# Patient Record
Sex: Female | Born: 1960 | Race: White | Hispanic: No | Smoking: Former smoker
Health system: Southern US, Community
[De-identification: ages and names within clinical notes are randomized; demographics above are authoritative.]

## PROBLEM LIST (undated history)

## (undated) DIAGNOSIS — J45909 Unspecified asthma, uncomplicated: Secondary | ICD-10-CM

## (undated) DIAGNOSIS — C50919 Malignant neoplasm of unspecified site of unspecified female breast: Secondary | ICD-10-CM

## (undated) DIAGNOSIS — I509 Heart failure, unspecified: Secondary | ICD-10-CM

## (undated) HISTORY — PX: APPENDECTOMY: SHX54

## (undated) HISTORY — PX: ABDOMINAL HYSTERECTOMY: SHX81

---

## 2002-09-23 ENCOUNTER — Emergency Department (HOSPITAL_COMMUNITY): Admission: EM | Admit: 2002-09-23 | Discharge: 2002-09-24 | Payer: Self-pay | Admitting: Emergency Medicine

## 2002-10-25 ENCOUNTER — Emergency Department (HOSPITAL_COMMUNITY): Admission: EM | Admit: 2002-10-25 | Discharge: 2002-10-25 | Payer: Self-pay | Admitting: Emergency Medicine

## 2002-10-27 ENCOUNTER — Encounter: Payer: Self-pay | Admitting: Emergency Medicine

## 2002-10-27 ENCOUNTER — Emergency Department (HOSPITAL_COMMUNITY): Admission: EM | Admit: 2002-10-27 | Discharge: 2002-10-27 | Payer: Self-pay | Admitting: Emergency Medicine

## 2003-06-05 ENCOUNTER — Ambulatory Visit (HOSPITAL_COMMUNITY): Admission: RE | Admit: 2003-06-05 | Discharge: 2003-06-05 | Payer: Self-pay | Admitting: Gastroenterology

## 2003-09-15 ENCOUNTER — Emergency Department (HOSPITAL_COMMUNITY): Admission: EM | Admit: 2003-09-15 | Discharge: 2003-09-15 | Payer: Self-pay | Admitting: Emergency Medicine

## 2004-01-04 ENCOUNTER — Emergency Department (HOSPITAL_COMMUNITY): Admission: EM | Admit: 2004-01-04 | Discharge: 2004-01-04 | Payer: Self-pay | Admitting: Emergency Medicine

## 2004-12-18 ENCOUNTER — Encounter: Admission: RE | Admit: 2004-12-18 | Discharge: 2004-12-18 | Payer: Self-pay | Admitting: Family Medicine

## 2005-04-16 ENCOUNTER — Emergency Department (HOSPITAL_COMMUNITY): Admission: EM | Admit: 2005-04-16 | Discharge: 2005-04-17 | Payer: Self-pay | Admitting: Emergency Medicine

## 2005-08-23 ENCOUNTER — Emergency Department: Payer: Self-pay | Admitting: Unknown Physician Specialty

## 2006-07-06 ENCOUNTER — Emergency Department: Payer: Self-pay | Admitting: Unknown Physician Specialty

## 2008-09-15 ENCOUNTER — Emergency Department: Payer: Self-pay | Admitting: Internal Medicine

## 2010-04-01 ENCOUNTER — Ambulatory Visit
Admission: RE | Admit: 2010-04-01 | Discharge: 2010-04-01 | Payer: Self-pay | Source: Home / Self Care | Attending: Family Medicine | Admitting: Family Medicine

## 2010-04-02 NOTE — Assessment & Plan Note (Signed)
NAME:  Terri Hughes, Terri Hughes NO.:  [id number]  MEDICAL RECORD NO.:  [id number]          PATIENT TYPE:  POB  LOCATION:  [facility] at [facility]         FACILITY:  [facility]  PHYSICIAN:  Tinnie Gens, MD        DATE OF BIRTH:  1960-03-16  DATE OF SERVICE:  04/01/2010                                 CLINIC NOTE  CHIEF COMPLAINT:  Physical exam and questionable prolapse.  HISTORY OF PRESENT ILLNESS:  The patient is a 50 year old gravida 7, para 6-0-1-7 who has a history of delivering twins.  She had 5 vaginal deliveries and 1 cesarean section for transverse lie.  The patient has had a previous hysterectomy for bleeding followed by a bilateral oophorectomy for cyst and scar tissue by Dr. Blima Rich in the past.  The patient had been doing well until in December she had a case of IBS and lot of straining on the toilet and noticed, it felt-like something was protruding out of her and she wondered if she had some trouble with vaginal vault prolapse.  She stated that it was feeling like she was sitting on something, lasted approximately 2 weeks and this seemed to resolve for now.  PAST MEDICAL HISTORY:  Significant for asthma, anxiety, depression, elevated cholesterol, irritable bowel syndrome.  PAST SURGICAL HISTORY:  She had a TVH and a bladder sling, laparoscopic BSO, C-section x1 and appendectomy.  MEDICATIONS:  She is on; 1. Paxil 10 mg p.o. nightly. 2. Xanax 0.5 mg 1 p.o. nightly.  ALLERGIES:  To NARCOTICS which caused rapid and violent emesis.  OBSTETRICAL HISTORY:  She is a G7, P 6-0-1-7, 1 miscarriage, 1 C-section for a transverse lie of her last pregnancy and she had twins vaginally.  GYNECOLOGIC HISTORY:  Last Pap was in 2008.  She has no history of abnormal Pap.  Last mammogram was in 2010 and was normal.  She had a colonoscopy in 2008 and also normal.  FAMILY HISTORY:  Significant for diabetes, heart disease, and high blood pressure.  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  She is a smoker, 2 packs per day for the past 20 years.  She does not drink alcohol.  She takes approximately 2 caffeinated beverages daily.  Her last son is at home who is 7 years old at present.  She has night sweats which has continuous surgical menopause.  PHYSICAL EXAMINATION:  VITAL SIGNS:  Today, her vitals are as noted in the chart. GENERAL:  She is a well-developed, well-nourished female in no acute distress. HEENT:  Normocephalic, atraumatic.  Sclerae anicteric. NECK:  Supple.  Normal thyroid. LUNGS:  Clear bilaterally. CV:  Regular rate and rhythm.  No rubs, gallops, or murmurs. ABDOMEN:  Soft, nontender, nondistended. EXTREMITIES:  No cyanosis, clubbing or edema.  2+ distal pulses. BREASTS:  Symmetric with everted nipples.  No masses.  No supraclavicular or axillary adenopathy. GU:  Normal external female genitalia.  There is some paleness and some loss of rugation in the vagina.  The cuff is intact.  The uterus and adnexa are surgically absent.  On further detailed exam with Valsalva, the patient has a little bit  of descensus in the midline anteriorly but the bladder neck and urethra are well supported.  The vaginal vault has not come down even with standing and Valsalva.  There is no significant rectocele noted.  IMPRESSION: 1. Physical exam. 2. Questionable prolapse, unable to find today.  PLAN: 1. We will send the patient for routine mammogram. 2. I have advised her if she has sensations something like the ones     she had before, she should call us and come in and let us do an     exam during that time.  I can find no true evidence of vaginal     vault prolapse.  She may have a little bit of descensus anteriorly.     However, the rest of the bladder seems well supported and I do not     recommend any sort of intervention at this time.          ______________________________ Tinnie Gens,  MD    TP/MEDQ  D:  04/01/2010  T:  04/02/2010  Job:  [id number]

## 2012-10-15 ENCOUNTER — Emergency Department: Payer: Self-pay | Admitting: Emergency Medicine

## 2013-06-17 ENCOUNTER — Emergency Department: Payer: Self-pay | Admitting: Emergency Medicine

## 2014-05-30 ENCOUNTER — Emergency Department: Payer: Self-pay | Admitting: Emergency Medicine

## 2016-08-27 ENCOUNTER — Emergency Department: Payer: Self-pay

## 2016-08-27 ENCOUNTER — Emergency Department
Admission: EM | Admit: 2016-08-27 | Discharge: 2016-08-27 | Disposition: A | Discharge status: Home / Self Care | Payer: Self-pay | Attending: Emergency Medicine | Admitting: Emergency Medicine

## 2016-08-27 DIAGNOSIS — Y929 Unspecified place or not applicable: Secondary | ICD-10-CM | POA: Insufficient documentation

## 2016-08-27 DIAGNOSIS — Y9389 Activity, other specified: Secondary | ICD-10-CM | POA: Insufficient documentation

## 2016-08-27 DIAGNOSIS — W07XXXA Fall from chair, initial encounter: Secondary | ICD-10-CM | POA: Insufficient documentation

## 2016-08-27 DIAGNOSIS — J45909 Unspecified asthma, uncomplicated: Secondary | ICD-10-CM | POA: Insufficient documentation

## 2016-08-27 DIAGNOSIS — Y999 Unspecified external cause status: Secondary | ICD-10-CM | POA: Insufficient documentation

## 2016-08-27 DIAGNOSIS — S20211A Contusion of right front wall of thorax, initial encounter: Secondary | ICD-10-CM | POA: Insufficient documentation

## 2016-08-27 DIAGNOSIS — I509 Heart failure, unspecified: Secondary | ICD-10-CM | POA: Insufficient documentation

## 2016-08-27 DIAGNOSIS — F17200 Nicotine dependence, unspecified, uncomplicated: Secondary | ICD-10-CM | POA: Insufficient documentation

## 2016-08-27 HISTORY — DX: Heart failure, unspecified: I50.9

## 2016-08-27 HISTORY — DX: Unspecified asthma, uncomplicated: J45.909

## 2016-08-27 NOTE — ED Notes (Signed)
See triage note  States she fell after tripping over her dog   Having pain to chest and rib area   Increased pain with movement and breathing

## 2016-08-27 NOTE — ED Triage Notes (Signed)
Pt states she tripped over her dog while getting up from a chair last night and fell on her chest, states she had a bowl in her hand and was Not able to brace with her hands.. Pt is c/o chest tenderness and BL lateral rib pain.

## 2016-08-27 NOTE — ED Provider Notes (Signed)
[facility] Emergency Department Provider Note   ____________________________________________   First MD Initiated Contact with Patient 08/27/16 1403     (approximate)  I have reviewed the triage vital signs and the nursing notes.   HISTORY  Chief Complaint Fall and Rib Injury    HPI Terri Hughes is a 56 y.o. female patient complaining of anterior and bilateral chest wall pain secondary to a fall last night. Patient states she was getting out of a chair last night and fell. Patient she will need a brace assault because she had a bowl in her pain. Patient complaining of anterior Center chest wall and bilateral rib pain. No palliative measures for complaint.Patient denies dyspnea but stated pain increases with deep inspirations. Patient rates the pain as a 4/10. Patient described a pain as "achy".   Past Medical History:  Diagnosis Date  . Asthma   . CHF (congestive heart failure) ([place])     There are no active problems to display for this patient.   Past Surgical History:  Procedure Laterality Date  . ABDOMINAL HYSTERECTOMY    . APPENDECTOMY    . CESAREAN SECTION      Prior to Admission medications   Not on File    Allergies Codeine  No family history on file.  Social History Social History  Substance Use Topics  . Smoking status: Current Every Day Smoker  . Smokeless tobacco: Never Used  . Alcohol use No    Review of Systems  Constitutional: No fever/chills Eyes: No visual changes. ENT: No sore throat. Cardiovascular: Denies chest pain. Respiratory: Denies shortness of breath. Gastrointestinal: No abdominal pain.  No nausea, no vomiting.  No diarrhea.  No constipation. Genitourinary: Negative for dysuria. Musculoskeletal: Negative for back pain. Skin: Negative for rash. Neurological: Negative for headaches, focal weakness or numbness. Allergic/Immunilogical: Codeine ____________________________________________   PHYSICAL  EXAM:  VITAL SIGNS: ED Triage Vitals  Enc Vitals Group     BP 08/27/16 1349 116/69     Pulse Rate 08/27/16 1349 78     Resp 08/27/16 1349 18     Temp 08/27/16 1349 98.3 F (36.8 C)     Temp Source 08/27/16 1349 Oral     SpO2 08/27/16 1349 95 %     Weight 08/27/16 1350 232 lb (105.2 kg)     Height 08/27/16 1350 5\' 2"  (1.575 m)     Head Circumference --      Peak Flow --      Pain Score 08/27/16 1349 4     Pain Loc --      Pain Edu? --      Excl. in [place]? --     Constitutional: Alert and oriented. Well appearing and in no acute distress. Cardiovascular: Normal rate, regular rhythm. Grossly normal heart sounds.  Good peripheral circulation. Respiratory: Normal respiratory effort.  No retractions. Lungs CTAB. Gastrointestinal: Soft and nontender. No distention. No abdominal bruits. No CVA tenderness. Musculoskeletal: No lower extremity tenderness nor edema.  No joint effusions. Neurologic:  Normal speech and language. No gross focal neurologic deficits are appreciated. No gait instability. Skin:  Skin is warm, dry and intact. No rash noted.No ecchymosis or abrasions. Psychiatric: Mood and affect are normal. Speech and behavior are normal.  ____________________________________________   LABS (all labs ordered are listed, but only abnormal results are displayed)  Labs Reviewed - No data to display ____________________________________________  EKG   ____________________________________________  RADIOLOGY  Dg Chest 2 View  Result Date: 08/27/2016  CLINICAL DATA:  Central anterior chest pain after falling last night. History of asthma/ COPD. EXAM: CHEST  2 VIEW COMPARISON:  None. FINDINGS: The heart size and mediastinal contours are normal. The lungs are clear. There is no pleural effusion or pneumothorax. No acute osseous findings are identified. IMPRESSION: No active cardiopulmonary process. No evidence of acute chest injury. Electronically Signed   By: Richardean Sale M.D.    On: 08/27/2016 14:25    No acute findings on chest x-ray ___________________________________________   PROCEDURES  Procedure(s) performed: None  Procedures  Critical Care performed: No  ____________________________________________   INITIAL IMPRESSION / ASSESSMENT AND PLAN / ED COURSE  Pertinent labs & imaging results that were available during my care of the patient were reviewed by me and considered in my medical decision making (see chart for details).  Chest wall contusion secondary to fall. Discussed negative chest x-ray findings with patient. Patient given discharge care instructions. Patient states she will continue taking ibuprofen as needed for pain. Patient advised follow-up family doctor if no improvement in 3-5 days. Return to ER for condition worsens.      ____________________________________________   FINAL CLINICAL IMPRESSION(S) / ED DIAGNOSES  Final diagnoses:  Contusion of right chest wall, initial encounter      NEW MEDICATIONS STARTED DURING THIS VISIT:  New Prescriptions   No medications on file     Note:  This document was prepared using Dragon voice recognition software and may include unintentional dictation errors.    Sable Feil, PA-C 08/27/16 1450    Sable Feil, PA-C 08/27/16 1450    Delman Kitten, MD 08/27/16 1710

## 2018-05-08 DIAGNOSIS — F32A Depression, unspecified: Secondary | ICD-10-CM | POA: Insufficient documentation

## 2018-05-08 DIAGNOSIS — M5020 Other cervical disc displacement, unspecified cervical region: Secondary | ICD-10-CM | POA: Insufficient documentation

## 2018-05-08 DIAGNOSIS — J329 Chronic sinusitis, unspecified: Secondary | ICD-10-CM | POA: Insufficient documentation

## 2018-05-08 DIAGNOSIS — F419 Anxiety disorder, unspecified: Secondary | ICD-10-CM | POA: Insufficient documentation

## 2018-05-08 DIAGNOSIS — K589 Irritable bowel syndrome without diarrhea: Secondary | ICD-10-CM | POA: Insufficient documentation

## 2018-05-08 DIAGNOSIS — G5603 Carpal tunnel syndrome, bilateral upper limbs: Secondary | ICD-10-CM | POA: Insufficient documentation

## 2018-05-08 DIAGNOSIS — J42 Unspecified chronic bronchitis: Secondary | ICD-10-CM | POA: Insufficient documentation

## 2018-07-10 ENCOUNTER — Ambulatory Visit: Payer: Self-pay | Attending: Oncology

## 2018-11-05 ENCOUNTER — Telehealth: Payer: Medicaid Other | Admitting: Nurse Practitioner

## 2018-11-05 DIAGNOSIS — N3001 Acute cystitis with hematuria: Secondary | ICD-10-CM | POA: Diagnosis not present

## 2018-11-05 MED ORDER — CEPHALEXIN 500 MG PO CAPS: 500.0000 mg | ORAL_CAPSULE | Freq: Two times a day (BID) | ORAL | 0 refills | Status: DC

## 2018-11-05 NOTE — Progress Notes (Signed)

## 2019-01-03 ENCOUNTER — Inpatient Hospital Stay
Admission: EM | Admit: 2019-01-03 | Discharge: 2019-01-08 | DRG: 189 | Disposition: A | Discharge status: Home / Self Care | Payer: Medicaid Other | Attending: Internal Medicine | Admitting: Internal Medicine

## 2019-01-03 ENCOUNTER — Other Ambulatory Visit: Payer: Self-pay

## 2019-01-03 ENCOUNTER — Emergency Department: Payer: Medicaid Other

## 2019-01-03 DIAGNOSIS — Z23 Encounter for immunization: Secondary | ICD-10-CM

## 2019-01-03 DIAGNOSIS — Z6841 Body Mass Index (BMI) 40.0 and over, adult: Secondary | ICD-10-CM

## 2019-01-03 DIAGNOSIS — J441 Chronic obstructive pulmonary disease with (acute) exacerbation: Secondary | ICD-10-CM | POA: Diagnosis present

## 2019-01-03 DIAGNOSIS — R0602 Shortness of breath: Secondary | ICD-10-CM

## 2019-01-03 DIAGNOSIS — Z79899 Other long term (current) drug therapy: Secondary | ICD-10-CM

## 2019-01-03 DIAGNOSIS — Z20828 Contact with and (suspected) exposure to other viral communicable diseases: Secondary | ICD-10-CM | POA: Diagnosis present

## 2019-01-03 DIAGNOSIS — J449 Chronic obstructive pulmonary disease, unspecified: Secondary | ICD-10-CM

## 2019-01-03 DIAGNOSIS — Z7951 Long term (current) use of inhaled steroids: Secondary | ICD-10-CM

## 2019-01-03 DIAGNOSIS — Z20822 Contact with and (suspected) exposure to covid-19: Secondary | ICD-10-CM

## 2019-01-03 DIAGNOSIS — R0902 Hypoxemia: Secondary | ICD-10-CM

## 2019-01-03 DIAGNOSIS — F17210 Nicotine dependence, cigarettes, uncomplicated: Secondary | ICD-10-CM | POA: Diagnosis present

## 2019-01-03 DIAGNOSIS — I509 Heart failure, unspecified: Secondary | ICD-10-CM | POA: Diagnosis present

## 2019-01-03 DIAGNOSIS — J9621 Acute and chronic respiratory failure with hypoxia: Principal | ICD-10-CM | POA: Diagnosis present

## 2019-01-03 DIAGNOSIS — Z90710 Acquired absence of both cervix and uterus: Secondary | ICD-10-CM

## 2019-01-03 LAB — CBC
HCT: 46.6 % — ABNORMAL HIGH (ref 36.0–46.0)
Hemoglobin: 14.5 g/dL (ref 12.0–15.0)
MCH: 29.4 pg (ref 26.0–34.0)
MCHC: 31.1 g/dL (ref 30.0–36.0)
MCV: 94.5 fL (ref 80.0–100.0)
Platelets: 202 10*3/uL (ref 150–400)
RBC: 4.93 MIL/uL (ref 3.87–5.11)
RDW: 13.6 % (ref 11.5–15.5)
WBC: 11.1 10*3/uL — ABNORMAL HIGH (ref 4.0–10.5)
nRBC: 0 % (ref 0.0–0.2)

## 2019-01-03 LAB — COMPREHENSIVE METABOLIC PANEL
ALT: 24 U/L (ref 0–44)
AST: 22 U/L (ref 15–41)
Albumin: 3.9 g/dL (ref 3.5–5.0)
Alkaline Phosphatase: 93 U/L (ref 38–126)
Anion gap: 12 (ref 5–15)
BUN: 10 mg/dL (ref 6–20)
CO2: 22 mmol/L (ref 22–32)
Calcium: 8.9 mg/dL (ref 8.9–10.3)
Chloride: 106 mmol/L (ref 98–111)
Creatinine, Ser: 0.7 mg/dL (ref 0.44–1.00)
GFR calc Af Amer: >60 mL/min (ref >60)
GFR calc non Af Amer: >60 mL/min (ref >60)
Glucose, Bld: 109 mg/dL — ABNORMAL HIGH (ref 70–99)
Potassium: 4.2 mmol/L (ref 3.5–5.1)
Sodium: 140 mmol/L (ref 135–145)
Total Bilirubin: 0.6 mg/dL (ref 0.3–1.2)
Total Protein: 7.3 g/dL (ref 6.5–8.1)

## 2019-01-03 LAB — BRAIN NATRIURETIC PEPTIDE: B Natriuretic Peptide: 37 pg/mL (ref 0.0–100.0)

## 2019-01-03 LAB — TROPONIN I (HIGH SENSITIVITY): Troponin I (High Sensitivity): 4 ng/L (ref <18)

## 2019-01-03 MED ORDER — IPRATROPIUM-ALBUTEROL 0.5-2.5 (3) MG/3ML IN SOLN
3.0000 mL | Freq: Once | RESPIRATORY_TRACT | Status: AC
  Administered 2019-01-04: 3 mL via RESPIRATORY_TRACT
  Filled 2019-01-03: qty 3

## 2019-01-03 MED ORDER — IPRATROPIUM-ALBUTEROL 0.5-2.5 (3) MG/3ML IN SOLN
3.0000 mL | Freq: Once | RESPIRATORY_TRACT | Status: AC
  Administered 2019-01-04: 01:00:00 3 mL via RESPIRATORY_TRACT
  Filled 2019-01-03: qty 3

## 2019-01-03 MED ORDER — METHYLPREDNISOLONE SODIUM SUCC 125 MG IJ SOLR
125.0000 mg | Freq: Once | INTRAMUSCULAR | Status: AC
  Administered 2019-01-03: 125 mg via INTRAVENOUS
  Filled 2019-01-03: qty 2

## 2019-01-03 MED ORDER — IPRATROPIUM-ALBUTEROL 0.5-2.5 (3) MG/3ML IN SOLN
3.0000 mL | Freq: Once | RESPIRATORY_TRACT | Status: AC
  Administered 2019-01-03: 3 mL via RESPIRATORY_TRACT
  Filled 2019-01-03: qty 3

## 2019-01-03 NOTE — ED Provider Notes (Signed)
[facility] Emergency Department Provider Note   ____________________________________________   First MD Initiated Contact with Patient 01/03/19 2300     (approximate)  I have reviewed the triage vital signs and the nursing notes.   HISTORY  Chief Complaint Fever and Cough    HPI Terri Hughes is a 58 y.o. female patient complains of fever and cough productive of small amounts of clear phlegm.  She reports her chest is been achy from the coughing.  Symptoms have been going on for 2 days.  She is not aching elsewhere in her body.  She is not sure if she has COPD exacerbation to flu or Covid.  Here she is not febrile.  She says she has never been as short of breath that she has right now.        Past Medical History:  Diagnosis Date   Asthma    CHF (congestive heart failure) [facility])     Patient Active Problem List   Diagnosis Date Noted   Acute on chronic respiratory failure with hypoxemia ([place]) 01/04/2019    Past Surgical History:  Procedure Laterality Date   ABDOMINAL HYSTERECTOMY     APPENDECTOMY     CESAREAN SECTION      Prior to Admission medications   Medication Sig Start Date End Date Taking? Authorizing Provider  albuterol (ACCUNEB) 0.63 MG/3ML nebulizer solution Take 1 ampule by nebulization every 6 (six) hours as needed for wheezing.   Yes [provider]  albuterol (VENTOLIN HFA) 108 (90 Base) MCG/ACT inhaler Inhale 1-2 puffs into the lungs every 4 (four) hours as needed for wheezing or shortness of breath.   Yes [provider]  budesonide-formoterol (SYMBICORT) 160-4.5 MCG/ACT inhaler Inhale 2 puffs into the lungs daily.    Yes [provider]  montelukast (SINGULAIR) 10 MG tablet Take 10 mg by mouth at bedtime.    Yes [provider]  omeprazole (PRILOSEC) 20 MG capsule Take 20 mg by mouth daily.    Yes [provider]    Allergies Codeine  No family history on file.  Social  History Social History   Tobacco Use   Smoking status: Current Every Day Smoker   Smokeless tobacco: Never Used  Substance Use Topics   Alcohol use: No   Drug use: No    Review of Systems  Constitutional:  fever/chills Eyes: No visual changes. ENT: No sore throat. Cardiovascular: chest pain see HPI. Respiratory:  shortness of breath. Gastrointestinal: No abdominal pain.  No nausea, no vomiting.  No diarrhea.  No constipation. Genitourinary: Negative for dysuria. Musculoskeletal: Negative for back pain. Skin: Negative for rash. Neurological: Negative for headaches, focal weakness    ____________________________________________   PHYSICAL EXAM:  VITAL SIGNS: ED Triage Vitals  Enc Vitals Group     BP 01/03/19 2026 (!) 149/53     Pulse Rate 01/03/19 2026 84     Resp 01/03/19 2026 (!) 24     Temp 01/03/19 2026 98.6 F (37 C)     Temp Source 01/03/19 2026 Oral     SpO2 01/03/19 2026 94 %     Weight 01/03/19 2027 230 lb (104.3 kg)     Height 01/03/19 2027 5\' 2"  (1.575 m)     Head Circumference --      Peak Flow --      Pain Score 01/03/19 2026 0     Pain Loc --      Pain Edu? --  Excl. in [place]? --     Constitutional: Alert and oriented. Well appearing and in no acute distress. Eyes: Conjunctivae are normal.  Head: Atraumatic. Nose: No congestion/rhinnorhea. Mouth/Throat: Mucous membranes are moist.  Oropharynx non-erythematous. Neck: No stridor Cardiovascular: Normal rate, regular rhythm. Grossly normal heart sounds.  Good peripheral circulation. Respiratory: Normal respiratory effort.  No retractions. Lungs scattered wheezes throughout Gastrointestinal: Soft and nontender. No distention. No abdominal bruits. No CVA tenderness. Musculoskeletal: No lower extremity tenderness nor edema.   Neurologic:  Normal speech and language. No gross focal neurologic deficits are appreciated. No gait instability. Skin:  Skin is warm, dry and intact. No rash  noted.   ____________________________________________   LABS (all labs ordered are listed, but only abnormal results are displayed)  Labs Reviewed  CBC - Abnormal; Notable for the following components:      Result Value   WBC 11.1 (*)    HCT 46.6 (*)    All other components within normal limits  COMPREHENSIVE METABOLIC PANEL - Abnormal; Notable for the following components:   Glucose, Bld 109 (*)    All other components within normal limits  SARS CORONAVIRUS 2 BY RT PCR (HOSPITAL ORDER, [place] [facility])  BRAIN NATRIURETIC PEPTIDE  HIV ANTIBODY (ROUTINE TESTING W REFLEX)  TSH  TROPONIN I (HIGH SENSITIVITY)   ____________________________________________  EKG  EKG read interpreted by me shows normal sinus rhythm rate of 75 normal axis no acute ST-T wave changes ____________________________________________  RADIOLOGY  ED MD interpretation: Chest x-ray read by radiology reviewed by me shows no acute changes  Official radiology report(s): Ct Angio Chest Pe W And/or Wo Contrast  Result Date: 01/04/2019 CLINICAL DATA:  Cough and fever. Shortness of breath EXAM: CT ANGIOGRAPHY CHEST WITH CONTRAST TECHNIQUE: Multidetector CT imaging of the chest was performed using the standard protocol during bolus administration of intravenous contrast. Multiplanar CT image reconstructions and MIPs were obtained to evaluate the vascular anatomy. CONTRAST:  74mL OMNIPAQUE IOHEXOL 350 MG/ML SOLN COMPARISON:  None. FINDINGS: Cardiovascular: --Pulmonary arteries: Contrast injection is sufficient to demonstrate satisfactory opacification of the pulmonary arteries to the segmental level, with attenuation of at least 200 HU at the main pulmonary artery. There is no pulmonary embolus. The main pulmonary artery is within normal limits for size. --Aorta: Satisfactory opacification of the thoracic aorta. No aortic dissection or other acute aortic syndrome. Conventional 3 vessel aortic  branching pattern. There is no aortic atherosclerosis. --Heart: Normal size. No pericardial effusion. Mediastinum/Nodes: No mediastinal, hilar or axillary lymphadenopathy. The visualized thyroid and thoracic esophageal course are unremarkable. Lungs/Pleura: No pulmonary nodules or masses. No pleural effusion or pneumothorax. No focal airspace consolidation. No focal pleural abnormality. Upper Abdomen: Contrast bolus timing is not optimized for evaluation of the abdominal organs. Small hiatal hernia. Musculoskeletal: No chest wall abnormality. No bony spinal canal stenosis. Review of the MIP images confirms the above findings. IMPRESSION: No pulmonary embolus or other acute thoracic abnormality. Electronically Signed   By: Ulyses Jarred M.D.   On: 01/04/2019 02:33   Dg Chest Port 1 View  Result Date: 01/03/2019 CLINICAL DATA:  Cough, congestion and fever EXAM: PORTABLE CHEST 1 VIEW COMPARISON:  Radiograph August 27, 2016 FINDINGS: Coarse basilar interstitial changes and airways thickening are similar to comparisons. No consolidation, features of edema, pneumothorax, or effusion. The cardiomediastinal contours are unremarkable. No acute osseous abnormality. Degenerative changes are present in the imaged shoulders. IMPRESSION: Chronic bronchitic and interstitial changes. No acute cardiopulmonary abnormality. Electronically Signed  By: Lovena Le M.D.   On: 01/03/2019 23:14    ____________________________________________   PROCEDURES  Procedure(s) performed (including Critical Care):  Procedures   ____________________________________________   INITIAL IMPRESSION / ASSESSMENT AND PLAN / ED COURSE  2 sats dropped from 95 TO 88 after 2 nebulizer treatments with duo nebs and oxygen.  Chest x-ray is normal patient has some achiness in her chest but that is about it.  EKG looks okay chest x-ray is clear.    Terri Hughes was evaluated in Emergency Department on 01/04/2019 for the symptoms described  in the history of present illness. She was evaluated in the context of the global COVID-19 pandemic, which necessitated consideration that the patient might be at risk for infection with the SARS-CoV-2 virus that causes COVID-19. Institutional protocols and algorithms that pertain to the evaluation of patients at risk for COVID-19 are in a state of rapid change based on information released by regulatory bodies including the CDC and federal and state organizations. These policies and algorithms were followed during the patient's care in the ED.    Chest CT shows no PE no pneumonia nothing looks like Covid.  Currently this is just COPD exacerbation is quite bad.  We will get her in the hospital and continue treating her.      ____________________________________________   FINAL CLINICAL IMPRESSION(S) / ED DIAGNOSES  Final diagnoses:  Hypoxia  Chronic obstructive pulmonary disease, unspecified COPD type [facility])     ED Discharge Orders    None       Note:  This document was prepared using Dragon voice recognition software and may include unintentional dictation errors.    Nena Polio, MD 01/04/19 [phone]

## 2019-01-03 NOTE — ED Notes (Signed)
Patient reports she had a 3-day COVID test this morning at this hospital

## 2019-01-03 NOTE — ED Triage Notes (Signed)
Pt in with co cough, runny nose, congestion and fever for few days. Pt has copd but states not usually this shob. Pt does not use oxygen but does svn tx without relief.

## 2019-01-04 ENCOUNTER — Emergency Department: Payer: Medicaid Other

## 2019-01-04 DIAGNOSIS — J441 Chronic obstructive pulmonary disease with (acute) exacerbation: Secondary | ICD-10-CM | POA: Diagnosis present

## 2019-01-04 DIAGNOSIS — J9621 Acute and chronic respiratory failure with hypoxia: Secondary | ICD-10-CM | POA: Diagnosis present

## 2019-01-04 DIAGNOSIS — Z20828 Contact with and (suspected) exposure to other viral communicable diseases: Secondary | ICD-10-CM | POA: Diagnosis present

## 2019-01-04 DIAGNOSIS — R0902 Hypoxemia: Secondary | ICD-10-CM | POA: Diagnosis not present

## 2019-01-04 DIAGNOSIS — Z79899 Other long term (current) drug therapy: Secondary | ICD-10-CM | POA: Diagnosis not present

## 2019-01-04 DIAGNOSIS — Z6841 Body Mass Index (BMI) 40.0 and over, adult: Secondary | ICD-10-CM | POA: Diagnosis not present

## 2019-01-04 DIAGNOSIS — R0602 Shortness of breath: Secondary | ICD-10-CM | POA: Diagnosis present

## 2019-01-04 DIAGNOSIS — Z7951 Long term (current) use of inhaled steroids: Secondary | ICD-10-CM | POA: Diagnosis not present

## 2019-01-04 DIAGNOSIS — Z90710 Acquired absence of both cervix and uterus: Secondary | ICD-10-CM | POA: Diagnosis not present

## 2019-01-04 DIAGNOSIS — Z23 Encounter for immunization: Secondary | ICD-10-CM | POA: Diagnosis not present

## 2019-01-04 DIAGNOSIS — F17210 Nicotine dependence, cigarettes, uncomplicated: Secondary | ICD-10-CM | POA: Diagnosis present

## 2019-01-04 DIAGNOSIS — I509 Heart failure, unspecified: Secondary | ICD-10-CM | POA: Diagnosis present

## 2019-01-04 LAB — TSH: TSH: 2.571 u[IU]/mL (ref 0.350–4.500)

## 2019-01-04 LAB — SARS CORONAVIRUS 2 BY RT PCR (HOSPITAL ORDER, PERFORMED IN ~~LOC~~ HOSPITAL LAB): SARS Coronavirus 2: NEGATIVE

## 2019-01-04 LAB — HIV ANTIBODY (ROUTINE TESTING W REFLEX): HIV Screen 4th Generation wRfx: NONREACTIVE

## 2019-01-04 MED ORDER — PNEUMOCOCCAL VAC POLYVALENT 25 MCG/0.5ML IJ INJ
0.5000 mL | INJECTION | INTRAMUSCULAR | Status: AC
  Administered 2019-01-05: 12:00:00 0.5 mL via INTRAMUSCULAR
  Filled 2019-01-04: qty 0.5

## 2019-01-04 MED ORDER — ENOXAPARIN SODIUM 40 MG/0.4ML ~~LOC~~ SOLN
40.0000 mg | SUBCUTANEOUS | Status: DC
  Administered 2019-01-04: 08:00:00 40 mg via SUBCUTANEOUS
  Filled 2019-01-04: qty 0.4

## 2019-01-04 MED ORDER — IOHEXOL 350 MG/ML SOLN
75.0000 mL | Freq: Once | INTRAVENOUS | Status: AC | PRN
  Administered 2019-01-04: 75 mL via INTRAVENOUS

## 2019-01-04 MED ORDER — PANTOPRAZOLE SODIUM 40 MG PO TBEC
40.0000 mg | DELAYED_RELEASE_TABLET | Freq: Every day | ORAL | Status: DC
  Administered 2019-01-04 – 2019-01-08 (×5): 40 mg via ORAL
  Filled 2019-01-04 (×5): qty 1

## 2019-01-04 MED ORDER — ACETAMINOPHEN 650 MG RE SUPP: 650.0000 mg | Freq: Four times a day (QID) | RECTAL | Status: DC | PRN

## 2019-01-04 MED ORDER — DOCUSATE SODIUM 100 MG PO CAPS
100.0000 mg | ORAL_CAPSULE | Freq: Two times a day (BID) | ORAL | Status: DC
  Administered 2019-01-04 (×2): 100 mg via ORAL
  Filled 2019-01-04 (×9): qty 1

## 2019-01-04 MED ORDER — GUAIFENESIN 100 MG/5ML PO SOLN
5.0000 mL | ORAL | Status: DC | PRN
  Administered 2019-01-04 – 2019-01-07 (×14): 100 mg via ORAL
  Filled 2019-01-04 (×19): qty 5

## 2019-01-04 MED ORDER — ENOXAPARIN SODIUM 40 MG/0.4ML ~~LOC~~ SOLN
40.0000 mg | Freq: Two times a day (BID) | SUBCUTANEOUS | Status: DC
  Administered 2019-01-04 – 2019-01-06 (×4): 40 mg via SUBCUTANEOUS
  Filled 2019-01-04 (×8): qty 0.4

## 2019-01-04 MED ORDER — ONDANSETRON HCL 4 MG/2ML IJ SOLN: 4.0000 mg | Freq: Four times a day (QID) | INTRAMUSCULAR | Status: DC | PRN

## 2019-01-04 MED ORDER — MOMETASONE FURO-FORMOTEROL FUM 200-5 MCG/ACT IN AERO
2.0000 | INHALATION_SPRAY | Freq: Two times a day (BID) | RESPIRATORY_TRACT | Status: DC
  Administered 2019-01-04 – 2019-01-08 (×8): 2 via RESPIRATORY_TRACT
  Filled 2019-01-04: qty 8.8

## 2019-01-04 MED ORDER — AZITHROMYCIN 500 MG PO TABS
500.0000 mg | ORAL_TABLET | Freq: Every day | ORAL | Status: AC
  Administered 2019-01-04: 500 mg via ORAL
  Filled 2019-01-04: qty 1

## 2019-01-04 MED ORDER — ALBUTEROL SULFATE (2.5 MG/3ML) 0.083% IN NEBU: 2.5000 mg | INHALATION_SOLUTION | Freq: Four times a day (QID) | RESPIRATORY_TRACT | Status: DC | PRN

## 2019-01-04 MED ORDER — ONDANSETRON HCL 4 MG PO TABS: 4.0000 mg | ORAL_TABLET | Freq: Four times a day (QID) | ORAL | Status: DC | PRN

## 2019-01-04 MED ORDER — ACETAMINOPHEN 325 MG PO TABS: 650.0000 mg | ORAL_TABLET | Freq: Four times a day (QID) | ORAL | Status: DC | PRN

## 2019-01-04 MED ORDER — METHYLPREDNISOLONE SODIUM SUCC 40 MG IJ SOLR
40.0000 mg | Freq: Two times a day (BID) | INTRAMUSCULAR | Status: DC
  Administered 2019-01-04 – 2019-01-08 (×8): 40 mg via INTRAVENOUS
  Filled 2019-01-04 (×8): qty 1

## 2019-01-04 MED ORDER — MONTELUKAST SODIUM 10 MG PO TABS
10.0000 mg | ORAL_TABLET | Freq: Every day | ORAL | Status: DC
  Administered 2019-01-04 – 2019-01-07 (×4): 10 mg via ORAL
  Filled 2019-01-04 (×5): qty 1

## 2019-01-04 MED ORDER — AZITHROMYCIN 250 MG PO TABS
250.0000 mg | ORAL_TABLET | Freq: Every day | ORAL | Status: AC
  Administered 2019-01-05 – 2019-01-08 (×4): 250 mg via ORAL
  Filled 2019-01-04 (×4): qty 1

## 2019-01-04 MED ORDER — PREDNISONE 50 MG PO TABS
50.0000 mg | ORAL_TABLET | Freq: Every day | ORAL | Status: DC
  Administered 2019-01-04: 50 mg via ORAL
  Filled 2019-01-04: qty 2

## 2019-01-04 NOTE — ED Notes (Signed)
Called pharmacy to request enoxaparin to be rescheduled.

## 2019-01-04 NOTE — ED Notes (Addendum)
Pt provided breakfast tray.

## 2019-01-04 NOTE — ED Notes (Signed)
Pt given meal tray. Pt eating.

## 2019-01-04 NOTE — H&P (Signed)
Terri Hughes is an 58 y.o. female.   Chief Complaint: Cough HPI: The patient with past medical history of asthma/COPD and reportedly congestive heart failure (although there is no documentation of this) presents to the emergency department complaining of cough.  The patient reports that she has had a cough productive of clear to white phlegm for the last 2 days.  She also reports subjective fevers during this time.  She admits to worsening dyspnea as well.  Chest x-ray in the emergency department did not show consolidation nor did the patient consistently meet criteria for sepsis.  She was given multiple breathing treatments as well as Solu-Medrol prior to the emergency department staff, and the hospitalist service for admission.  Past Medical History:  Diagnosis Date  . Asthma   . CHF (congestive heart failure) ([place])     Past Surgical History:  Procedure Laterality Date  . ABDOMINAL HYSTERECTOMY    . APPENDECTOMY    . CESAREAN SECTION      No family history on file.  Patient does not feel up to talk no family history at this time  Social History:  reports that she has been smoking. She has never used smokeless tobacco. She reports that she does not drink alcohol or use drugs.  Allergies:  Allergies  Allergen Reactions  . Codeine Nausea And Vomiting    Prior to Admission medications   Medication Sig Start Date End Date Taking? Authorizing Provider  albuterol (ACCUNEB) 0.63 MG/3ML nebulizer solution Take 1 ampule by nebulization every 6 (six) hours as needed for wheezing.   Yes [provider]  albuterol (VENTOLIN HFA) 108 (90 Base) MCG/ACT inhaler Inhale 1-2 puffs into the lungs every 4 (four) hours as needed for wheezing or shortness of breath.   Yes [provider]  budesonide-formoterol (SYMBICORT) 160-4.5 MCG/ACT inhaler Inhale 2 puffs into the lungs daily.    Yes [provider]  montelukast (SINGULAIR) 10 MG tablet Take 10 mg by mouth at bedtime.     Yes [provider]  omeprazole (PRILOSEC) 20 MG capsule Take 20 mg by mouth daily.    Yes [provider]     Results for orders placed or performed during the hospital encounter of 01/03/19 (from the past 48 hour(s))  CBC     Status: Abnormal   Collection Time: 01/03/19  8:33 PM  Result Value Ref Range   WBC 11.1 (H) 4.0 - 10.5 K/uL   RBC 4.93 3.87 - 5.11 MIL/uL   Hemoglobin 14.5 12.0 - 15.0 g/dL   HCT 46.6 (H) 36.0 - 46.0 %   MCV 94.5 80.0 - 100.0 fL   MCH 29.4 26.0 - 34.0 pg   MCHC 31.1 30.0 - 36.0 g/dL   RDW 13.6 11.5 - 15.5 %   Platelets 202 150 - 400 K/uL   nRBC 0.0 0.0 - 0.2 %    Comment: Performed at [facility], [address] [place], [place] [address]  Comprehensive metabolic panel     Status: Abnormal   Collection Time: 01/03/19  8:33 PM  Result Value Ref Range   Sodium 140 135 - 145 mmol/L   Potassium 4.2 3.5 - 5.1 mmol/L   Chloride 106 98 - 111 mmol/L   CO2 22 22 - 32 mmol/L   Glucose, Bld 109 (H) 70 - 99 mg/dL   BUN 10 6 - 20 mg/dL   Creatinine, Ser 0.70 0.44 - 1.00 mg/dL   Calcium 8.9 8.9 - 10.3 mg/dL  Total Protein 7.3 6.5 - 8.1 g/dL   Albumin 3.9 3.5 - 5.0 g/dL   AST 22 15 - 41 U/L   ALT 24 0 - 44 U/L   Alkaline Phosphatase 93 38 - 126 U/L   Total Bilirubin 0.6 0.3 - 1.2 mg/dL   GFR calc non Af Amer >60 >60 mL/min   GFR calc Af Amer >60 >60 mL/min   Anion gap 12 5 - 15    Comment: Performed at [facility], [place], [place] [address]  Troponin I (High Sensitivity)     Status: None   Collection Time: 01/03/19  8:33 PM  Result Value Ref Range   Troponin I (High Sensitivity) 4 <18 ng/L    Comment: (NOTE) Elevated high sensitivity troponin I (hsTnI) values and significant  changes across serial measurements may suggest ACS but many other  chronic and acute conditions are known to elevate hsTnI results.  Refer to the "Links" section for chest pain algorithms and additional  guidance. Performed at  [facility], [address] [place], [place] [address]   Brain natriuretic peptide     Status: None   Collection Time: 01/03/19  8:33 PM  Result Value Ref Range   B Natriuretic Peptide 37.0 0.0 - 100.0 pg/mL    Comment: Performed at [facility], [address] [place], [place] [address]  SARS Coronavirus 2 by RT PCR (hospital order, performed in [facility] hospital lab) Nasopharyngeal Nasopharyngeal Swab     Status: None   Collection Time: 01/04/19 12:42 AM   Specimen: Nasopharyngeal Swab  Result Value Ref Range   SARS Coronavirus 2 NEGATIVE NEGATIVE    Comment: (NOTE) If result is NEGATIVE SARS-CoV-2 target nucleic acids are NOT DETECTED. The SARS-CoV-2 RNA is generally detectable in upper and lower  respiratory specimens during the acute phase of infection. The lowest  concentration of SARS-CoV-2 viral copies this assay can detect is 250  copies / mL. A negative result does not preclude SARS-CoV-2 infection  and should not be used as the sole basis for treatment or other  patient management decisions.  A negative result may occur with  improper specimen collection / handling, submission of specimen other  than nasopharyngeal swab, presence of viral mutation(s) within the  areas targeted by this assay, and inadequate number of viral copies  (<250 copies / mL). A negative result must be combined with clinical  observations, patient history, and epidemiological information. If result is POSITIVE SARS-CoV-2 target nucleic acids are DETECTED. The SARS-CoV-2 RNA is generally detectable in upper and lower  respiratory specimens dur ing the acute phase of infection.  Positive  results are indicative of active infection with SARS-CoV-2.  Clinical  correlation with patient history and other diagnostic information is  necessary to determine patient infection status.  Positive results do  not rule out bacterial infection or co-infection with other viruses. If result  is PRESUMPTIVE POSTIVE SARS-CoV-2 nucleic acids MAY BE PRESENT.   A presumptive positive result was obtained on the submitted specimen  and confirmed on repeat testing.  While 2019 novel coronavirus  (SARS-CoV-2) nucleic acids may be present in the submitted sample  additional confirmatory testing may be necessary for epidemiological  and / or clinical management purposes  to differentiate between  SARS-CoV-2 and other Sarbecovirus currently known to infect humans.  If clinically indicated additional testing with an alternate test  methodology [phone]) is advised. The SARS-CoV-2 RNA is generally  detectable in upper and  lower respiratory sp ecimens during the acute  phase of infection. The expected result is Negative. Fact Sheet for Patients:  StrictlyIdeas.no Fact Sheet for Healthcare Providers: BankingDealers.co.za This test is not yet approved or cleared by the [place] FDA and has been authorized for detection and/or diagnosis of SARS-CoV-2 by FDA under an Emergency Use Authorization (EUA).  This EUA will remain in effect (meaning this test can be used) for the duration of the COVID-19 declaration under Section 564(b)(1) of the Act, 21 U.S.C. section 360bbb-3(b)(1), unless the authorization is terminated or revoked sooner. Performed at [facility], [address] [place], [place] [address]    Ct Angio Chest Pe W And/or Wo Contrast  Result Date: 01/04/2019 CLINICAL DATA:  Cough and fever. Shortness of breath EXAM: CT ANGIOGRAPHY CHEST WITH CONTRAST TECHNIQUE: Multidetector CT imaging of the chest was performed using the standard protocol during bolus administration of intravenous contrast. Multiplanar CT image reconstructions and MIPs were obtained to evaluate the vascular anatomy. CONTRAST:  74mL OMNIPAQUE IOHEXOL 350 MG/ML SOLN COMPARISON:  None. FINDINGS: Cardiovascular: --Pulmonary arteries: Contrast injection is  sufficient to demonstrate satisfactory opacification of the pulmonary arteries to the segmental level, with attenuation of at least 200 HU at the main pulmonary artery. There is no pulmonary embolus. The main pulmonary artery is within normal limits for size. --Aorta: Satisfactory opacification of the thoracic aorta. No aortic dissection or other acute aortic syndrome. Conventional 3 vessel aortic branching pattern. There is no aortic atherosclerosis. --Heart: Normal size. No pericardial effusion. Mediastinum/Nodes: No mediastinal, hilar or axillary lymphadenopathy. The visualized thyroid and thoracic esophageal course are unremarkable. Lungs/Pleura: No pulmonary nodules or masses. No pleural effusion or pneumothorax. No focal airspace consolidation. No focal pleural abnormality. Upper Abdomen: Contrast bolus timing is not optimized for evaluation of the abdominal organs. Small hiatal hernia. Musculoskeletal: No chest wall abnormality. No bony spinal canal stenosis. Review of the MIP images confirms the above findings. IMPRESSION: No pulmonary embolus or other acute thoracic abnormality. Electronically Signed   By: Ulyses Jarred M.D.   On: 01/04/2019 02:33   Dg Chest Port 1 View  Result Date: 01/03/2019 CLINICAL DATA:  Cough, congestion and fever EXAM: PORTABLE CHEST 1 VIEW COMPARISON:  Radiograph August 27, 2016 FINDINGS: Coarse basilar interstitial changes and airways thickening are similar to comparisons. No consolidation, features of edema, pneumothorax, or effusion. The cardiomediastinal contours are unremarkable. No acute osseous abnormality. Degenerative changes are present in the imaged shoulders. IMPRESSION: Chronic bronchitic and interstitial changes. No acute cardiopulmonary abnormality. Electronically Signed   By: Lovena Le M.D.   On: 01/03/2019 23:14    Review of Systems  Constitutional: Negative for chills and fever.  HENT: Negative for sore throat and tinnitus.   Eyes: Negative for blurred  vision and redness.  Respiratory: Positive for cough, sputum production and wheezing. Negative for shortness of breath.   Cardiovascular: Negative for chest pain, palpitations, orthopnea and PND.  Gastrointestinal: Negative for abdominal pain, diarrhea, nausea and vomiting.  Genitourinary: Negative for dysuria, frequency and urgency.  Musculoskeletal: Negative for joint pain and myalgias.  Skin: Negative for rash.       No lesions  Neurological: Negative for speech change, focal weakness and weakness.  Endo/Heme/Allergies: Does not bruise/bleed easily.       No temperature intolerance  Psychiatric/Behavioral: Negative for depression and suicidal ideas.    Blood pressure 117/66, pulse (!) 106, temperature 98.6 F (37 C), temperature source Oral, resp. rate (!) 21, height 5\' 2"  (1.575 m),  weight 104.3 kg, SpO2 93 %. Physical Exam  Vitals reviewed. Constitutional: She is oriented to person, place, and time. She appears well-developed and well-nourished. No distress.  HENT:  Head: Normocephalic and atraumatic.  Mouth/Throat: Oropharynx is clear and moist.  Eyes: Pupils are equal, round, and reactive to light. Conjunctivae and EOM are normal. No scleral icterus.  Neck: Normal range of motion. No tracheal deviation present. No thyromegaly present.  Cardiovascular: Normal rate, regular rhythm and normal heart sounds. Exam reveals no gallop and no friction rub.  No murmur heard. Respiratory: Effort normal and breath sounds normal. Tachypnea noted.  GI: Soft. Bowel sounds are normal. She exhibits no distension. There is no abdominal tenderness.  Genitourinary:    Genitourinary Comments: Deferred   Musculoskeletal: Normal range of motion.        General: No edema.  Lymphadenopathy:    She has no cervical adenopathy.  Neurological: She is alert and oriented to person, place, and time. No cranial nerve deficit. She exhibits normal muscle tone.  Skin: Skin is warm and dry. No rash noted. No  erythema.  Psychiatric: She has a normal mood and affect. Her behavior is normal. Judgment and thought content normal.     Assessment/Plan This is a 58 year old female admitted for COPD exacerbation. 1.  Respiratory failure: Acute on chronic; continue prednisone.  Azithromycin for anti-inflammatory effect.  Supplemental oxygen as needed. 2.  COPD: With exacerbation; continue inhaled corticosteroid.  Albuterol as needed.. 3.  Morbid obesity: BMI is 42; encourage healthy diet and exercise 4.  DVT prophylaxis: Lovenox 5.  GI prophylaxis: PPI per home regimen The patient is a full code.  Time spent on admission orders and patient care approximately 45 minutes   Harrie Foreman, MD 01/04/2019, 3:50 AM

## 2019-01-04 NOTE — ED Notes (Signed)
Patient continues to report dyspnea at rest after breathing treatments. Patient has expiratory wheezes all fields. Patient tachypneic, with respiratory rate 22-25 at rest.

## 2019-01-04 NOTE — Progress Notes (Signed)
PHARMACIST - PHYSICIAN COMMUNICATION  CONCERNING:  Enoxaparin (Lovenox) for DVT Prophylaxis    RECOMMENDATION: Patient was prescribed enoxaprin 40mg  q24 hours for VTE prophylaxis.   Filed Weights   01/03/19 2027  Weight: 230 lb (104.3 kg)    Body mass index is 42.07 kg/m.  Estimated Creatinine Clearance: 86.9 mL/min (by C-G formula based on SCr of 0.7 mg/dL).   Based on [place] patient is candidate for enoxaparin 40mg  every 12 hour dosing due to BMI being >40.  DESCRIPTION: Pharmacy has adjusted enoxaparin dose per [facility] policy.  Patient is now receiving enoxaparin 40mg  every 12 hours.   Lu Duffel, PharmD, BCPS Clinical Pharmacist 01/04/2019 2:07 PM

## 2019-01-04 NOTE — ED Notes (Signed)
Patient switched form ED stretcher to hospital bed.

## 2019-01-04 NOTE — ED Notes (Signed)
Patient 90-91% oxygen saturation or RA. Patient placed on 2L [place]. RN will continue to monitor.

## 2019-01-04 NOTE — ED Notes (Addendum)
Patient up to toilet. Patient's heart rate increased to 121 after ambulation to toilet. Patient's respiratory rate increased to 30 breaths per minute.

## 2019-01-04 NOTE — ED Notes (Signed)
ED Provider at bedside. 

## 2019-01-04 NOTE — ED Notes (Signed)
Patient given lunch tray. Patient denies discomfort. Nasal cannula is in place on 2L O2.

## 2019-01-04 NOTE — ED Notes (Signed)
Patient is sitting on side of the stretcher with her tablet and cell phone. NAD.

## 2019-01-04 NOTE — ED Notes (Signed)
Patient is active in her room. Patient has taken blood pressure cuff off for mobility.

## 2019-01-04 NOTE — Progress Notes (Signed)
[place] [facility] [place] NAME: Terri Hughes    MR#:  [id number]  DATE OF BIRTH:  1960/08/31  SUBJECTIVE:  CHIEF COMPLAINT:   Chief Complaint  Patient presents with  . Fever  . Cough   No new complaint.  Shortness of breath and wheezing improving.  Cough improving.  No fevers  REVIEW OF SYSTEMS:  Review of Systems  Constitutional: Negative for chills and fever.  HENT: Negative for hearing loss and tinnitus.   Eyes: Negative for blurred vision and double vision.  Respiratory:       Shortness of breath and cough with wheezing improving  Cardiovascular: Negative for chest pain and orthopnea.  Gastrointestinal: Negative for heartburn and nausea.  Genitourinary: Negative for dysuria and urgency.  Musculoskeletal: Negative for myalgias and neck pain.  Skin: Negative for itching and rash.  Neurological: Negative for dizziness and headaches.  Psychiatric/Behavioral: Negative for depression and substance abuse.    DRUG ALLERGIES:   Allergies  Allergen Reactions  . Codeine Nausea And Vomiting   VITALS:  Blood pressure 127/63, pulse 96, temperature 98.3 F (36.8 C), temperature source Oral, resp. rate 18, height 5\' 2"  (1.575 m), weight 104.3 kg, SpO2 94 %. PHYSICAL EXAMINATION:   Physical Exam  Constitutional: She is oriented to person, place, and time and well-developed, well-nourished, and in no distress.  HENT:  Head: Normocephalic and atraumatic.  Right Ear: External ear normal.  Eyes: Pupils are equal, round, and reactive to light. Conjunctivae are normal. Right eye exhibits no discharge.  Neck: Normal range of motion. Neck supple. No thyromegaly present.  Cardiovascular: Normal rate, regular rhythm and normal heart sounds.  Pulmonary/Chest: Effort normal. No respiratory distress. She has wheezes.  Abdominal: Soft. Bowel sounds are normal. She exhibits no distension.  Musculoskeletal: Normal range of motion.        General: No  edema.  Neurological: She is alert and oriented to person, place, and time. No cranial nerve deficit.  Skin: Skin is warm. She is not diaphoretic. No erythema.  Psychiatric: Affect and judgment normal.   LABORATORY PANEL:  Female CBC Recent Labs  Lab 01/03/19 2033  WBC 11.1*  HGB 14.5  HCT 46.6*  PLT 202   ------------------------------------------------------------------------------------------------------------------ Chemistries  Recent Labs  Lab 01/03/19 2033  NA 140  K 4.2  CL 106  CO2 22  GLUCOSE 109*  BUN 10  CREATININE 0.70  CALCIUM 8.9  AST 22  ALT 24  ALKPHOS 93  BILITOT 0.6   RADIOLOGY:  Ct Angio Chest Pe W And/or Wo Contrast  Result Date: 01/04/2019 CLINICAL DATA:  Cough and fever. Shortness of breath EXAM: CT ANGIOGRAPHY CHEST WITH CONTRAST TECHNIQUE: Multidetector CT imaging of the chest was performed using the standard protocol during bolus administration of intravenous contrast. Multiplanar CT image reconstructions and MIPs were obtained to evaluate the vascular anatomy. CONTRAST:  32mL OMNIPAQUE IOHEXOL 350 MG/ML SOLN COMPARISON:  None. FINDINGS: Cardiovascular: --Pulmonary arteries: Contrast injection is sufficient to demonstrate satisfactory opacification of the pulmonary arteries to the segmental level, with attenuation of at least 200 HU at the main pulmonary artery. There is no pulmonary embolus. The main pulmonary artery is within normal limits for size. --Aorta: Satisfactory opacification of the thoracic aorta. No aortic dissection or other acute aortic syndrome. Conventional 3 vessel aortic branching pattern. There is no aortic atherosclerosis. --Heart: Normal size. No pericardial effusion. Mediastinum/Nodes: No mediastinal, hilar or axillary lymphadenopathy. The visualized thyroid and thoracic esophageal course are unremarkable.  Lungs/Pleura: No pulmonary nodules or masses. No pleural effusion or pneumothorax. No focal airspace consolidation. No focal  pleural abnormality. Upper Abdomen: Contrast bolus timing is not optimized for evaluation of the abdominal organs. Small hiatal hernia. Musculoskeletal: No chest wall abnormality. No bony spinal canal stenosis. Review of the MIP images confirms the above findings. IMPRESSION: No pulmonary embolus or other acute thoracic abnormality. Electronically Signed   By: Ulyses Jarred M.D.   On: 01/04/2019 02:33   Dg Chest Port 1 View  Result Date: 01/03/2019 CLINICAL DATA:  Cough, congestion and fever EXAM: PORTABLE CHEST 1 VIEW COMPARISON:  Radiograph August 27, 2016 FINDINGS: Coarse basilar interstitial changes and airways thickening are similar to comparisons. No consolidation, features of edema, pneumothorax, or effusion. The cardiomediastinal contours are unremarkable. No acute osseous abnormality. Degenerative changes are present in the imaged shoulders. IMPRESSION: Chronic bronchitic and interstitial changes. No acute cardiopulmonary abnormality. Electronically Signed   By: Lovena Le M.D.   On: 01/03/2019 23:14   ASSESSMENT AND PLAN:   This is a 58 year old female admitted for COPD exacerbation. 1.  Respiratory failure: Acute on chronic;  Secondary to COPD exacerbation  Management of COPD as outlined below  Supplemental oxygen  2.  COPD: With exacerbation; Placed on IV Solu-Medrol.  Empiric antibiotics with azithromycin.  Nebulizer treatments.  As needed Robitussin supplemental oxygen.  Monitor clinically.   3.  Morbid obesity: BMI is 42; encourage healthy diet and exercise  DVT prophylaxis: Lovenox    All the records are reviewed and case discussed with Care Management/Social Worker. Management plans discussed with the patient, family and they are in agreement.  CODE STATUS: Full Code  TOTAL TIME TAKING CARE OF THIS PATIENT: 28 minutes.   More than 50% of the time was spent in counseling/coordination of care: YES  POSSIBLE D/C IN 2 DAYS, DEPENDING ON CLINICAL CONDITION.   Terri Hughes  M.D on 01/04/2019 at 3:11 PM  Between 7am to 6pm - Pager - [phone]  After 6pm go to [email or web address] - Proofreader  [facility]  Office  [phone]  CC: Primary care physician; Kerri Perches, PA-C  Note: This dictation was prepared with Dragon dictation along with smaller phrase technology. Any transcriptional errors that result from this process are unintentional.

## 2019-01-05 ENCOUNTER — Encounter: Payer: Self-pay | Admitting: *Deleted

## 2019-01-05 LAB — BASIC METABOLIC PANEL
Anion gap: 14 (ref 5–15)
BUN: 18 mg/dL (ref 6–20)
CO2: 22 mmol/L (ref 22–32)
Calcium: 9.2 mg/dL (ref 8.9–10.3)
Chloride: 104 mmol/L (ref 98–111)
Creatinine, Ser: 0.91 mg/dL (ref 0.44–1.00)
GFR calc Af Amer: >60 mL/min (ref >60)
GFR calc non Af Amer: >60 mL/min (ref >60)
Glucose, Bld: 176 mg/dL — ABNORMAL HIGH (ref 70–99)
Potassium: 4.9 mmol/L (ref 3.5–5.1)
Sodium: 140 mmol/L (ref 135–145)

## 2019-01-05 LAB — MAGNESIUM: Magnesium: 2.4 mg/dL (ref 1.7–2.4)

## 2019-01-05 LAB — NOVEL CORONAVIRUS, NAA: SARS-CoV-2, NAA: NOT DETECTED

## 2019-01-05 NOTE — Progress Notes (Signed)
[place] [facility] [place] NAME: Terri Hughes    MR#:  [id number]  DATE OF BIRTH:  30-Dec-1960  SUBJECTIVE:  CHIEF COMPLAINT:   Chief Complaint  Patient presents with  . Fever  . Cough   No new complaint.  Shortness of breath and wheezing improving.  Cough improving.  No fevers Sitting up at bedside.  REVIEW OF SYSTEMS:  Review of Systems  Constitutional: Negative for chills and fever.  HENT: Negative for hearing loss and tinnitus.   Eyes: Negative for blurred vision and double vision.  Respiratory:       Shortness of breath and cough with wheezing improving  Cardiovascular: Negative for chest pain and orthopnea.  Gastrointestinal: Negative for heartburn and nausea.  Genitourinary: Negative for dysuria and urgency.  Musculoskeletal: Negative for myalgias and neck pain.  Skin: Negative for itching and rash.  Neurological: Negative for dizziness and headaches.  Psychiatric/Behavioral: Negative for depression and substance abuse.    DRUG ALLERGIES:   Allergies  Allergen Reactions  . Codeine Nausea And Vomiting   VITALS:  Blood pressure 121/73, pulse 64, temperature 98.3 F (36.8 C), temperature source Oral, resp. rate 18, height 5\' 2"  (1.575 m), weight 108.1 kg, SpO2 95 %. PHYSICAL EXAMINATION:   Physical Exam  Constitutional: She is oriented to person, place, and time and well-developed, well-nourished, and in no distress.  HENT:  Head: Normocephalic and atraumatic.  Right Ear: External ear normal.  Eyes: Pupils are equal, round, and reactive to light. Conjunctivae are normal. Right eye exhibits no discharge.  Neck: Normal range of motion. Neck supple. No thyromegaly present.  Cardiovascular: Normal rate, regular rhythm and normal heart sounds.  Pulmonary/Chest: Effort normal. No respiratory distress. She has wheezes.  Abdominal: Soft. Bowel sounds are normal. She exhibits no distension.  Musculoskeletal: Normal range of motion.         General: No edema.  Neurological: She is alert and oriented to person, place, and time. No cranial nerve deficit.  Skin: Skin is warm. She is not diaphoretic. No erythema.  Psychiatric: Affect and judgment normal.   LABORATORY PANEL:  Female CBC Recent Labs  Lab 01/03/19 2033  WBC 11.1*  HGB 14.5  HCT 46.6*  PLT 202   ------------------------------------------------------------------------------------------------------------------ Chemistries  Recent Labs  Lab 01/03/19 2033 01/05/19 0640  NA 140 140  K 4.2 4.9  CL 106 104  CO2 22 22  GLUCOSE 109* 176*  BUN 10 18  CREATININE 0.70 0.91  CALCIUM 8.9 9.2  MG  --  2.4  AST 22  --   ALT 24  --   ALKPHOS 93  --   BILITOT 0.6  --    RADIOLOGY:  No results found. ASSESSMENT AND PLAN:   This is a 58 year old female admitted for COPD exacerbation. 1.  Acute hypoxic Respiratory failure: Improving Secondary to COPD exacerbation  Management of COPD as outlined below  Supplemental oxygen.  Oxygen saturation of 95% on 2 L.  Patient does not use oxygen at home. Home oxygen therapy evaluation prior to discharge  2.  COPD: With exacerbation; Placed on IV Solu-Medrol.  Empiric antibiotics with azithromycin.  Nebulizer treatments.  As needed Robitussin supplemental oxygen.  Monitor clinically.   3.  Morbid obesity: BMI is 42; encourage healthy diet and exercise  DVT prophylaxis: Lovenox  All the records are reviewed and case discussed with Care Management/Social Worker. Management plans discussed with the patient, and she is in agreement.  CODE STATUS:  Full Code  TOTAL TIME TAKING CARE OF THIS PATIENT: 27 minutes.   More than 50% of the time was spent in counseling/coordination of care: YES  POSSIBLE D/C IN 2 DAYS, DEPENDING ON CLINICAL CONDITION.   Ashe Gago M.D on 01/05/2019 at 1:12 PM  Between 7am to 6pm - Pager - [phone]  After 6pm go to [email or web address] - Proofreader  [facility]  Office  [phone]  CC: Primary care physician; Kerri Perches, PA-C  Note: This dictation was prepared with Dragon dictation along with smaller phrase technology. Any transcriptional errors that result from this process are unintentional.

## 2019-01-06 LAB — BASIC METABOLIC PANEL
Anion gap: 8 (ref 5–15)
BUN: 20 mg/dL (ref 6–20)
CO2: 28 mmol/L (ref 22–32)
Calcium: 9.2 mg/dL (ref 8.9–10.3)
Chloride: 103 mmol/L (ref 98–111)
Creatinine, Ser: 0.76 mg/dL (ref 0.44–1.00)
GFR calc Af Amer: >60 mL/min (ref >60)
GFR calc non Af Amer: >60 mL/min (ref >60)
Glucose, Bld: 175 mg/dL — ABNORMAL HIGH (ref 70–99)
Potassium: 4.6 mmol/L (ref 3.5–5.1)
Sodium: 139 mmol/L (ref 135–145)

## 2019-01-06 NOTE — Evaluation (Signed)
Physical Therapy Evaluation Patient Details Name: Terri Hughes MRN: [id number] DOB: [birth year] Today's Date: 01/06/2019   History of Present Illness  Pt is a 58 year old female admitted with tachycardia, tachypnea after c/o SOB, wheezing.  PMh includes COPD, CHF and asthma.  Clinical Impression  Pt is a 58 year old female who is an independent, mostly household ambulator, with mobility at baseline.  She is sitting up in bed and able to perform bed mobility and STS independently.  Pt immediately becomes SOB and starts coughing and wheezing upon standing, maintaining a flexed posture.  She desat to 85% and quickly recovered to 92% when sitting at EOB.  Pt with fair strength of UE/LE and reported no new pain, N/T or weakness.  She did hold to bed for stability and stated that she feels that she "might need a cane" for support during household ambulation.  Pt will continue to benefit from skilled PT with focus on tolerance to activity, balance and use of AD and safe functional mobility.  Pt may benefit from cardiac rehabilitation for to increase tolerance to activity at this time.    Follow Up Recommendations Home health PT;Supervision - Intermittent(Pt may be more appropriate for cardiac rehabilitation.)    Equipment Recommendations  Cane    Recommendations for Other Services       Precautions / Restrictions Restrictions Weight Bearing Restrictions: No      Mobility  Bed Mobility Overal bed mobility: Independent                Transfers Overall transfer level: Independent                  Ambulation/Gait Ambulation/Gait assistance: (Unable to assess due to O2 desat.  Pt coughing and wheezing.)              Stairs            Wheelchair Mobility    Modified Rankin (Stroke Patients Only)       Balance Overall balance assessment: Modified Independent(Pt holding to bed rail while couging and O2 desat.  Stated that she feels she might need a cane.)                                            Pertinent Vitals/Pain Pain Assessment: No/denies pain    Home Living Family/patient expects to be discharged to:: Private residence Living Arrangements: Children(granddaughter) Available Help at Discharge: Family;Available PRN/intermittently Type of Home: House         Home Equipment: None      Prior Function Level of Independence: Independent         Comments: Mostly household ambulation without AD.     Hand Dominance        Extremity/Trunk Assessment   Upper Extremity Assessment Upper Extremity Assessment: Overall WFL for tasks assessed    Lower Extremity Assessment Lower Extremity Assessment: Overall WFL for tasks assessed    Cervical / Trunk Assessment Cervical / Trunk Assessment: Normal  Communication   Communication: No difficulties  Cognition Arousal/Alertness: Awake/alert Behavior During Therapy: WFL for tasks assessed/performed Overall Cognitive Status: Within Functional Limits for tasks assessed  General Comments      Exercises     Assessment/Plan    PT Assessment Patient needs continued PT services  PT Problem List Decreased strength;Decreased mobility;Decreased activity tolerance;Cardiopulmonary status limiting activity;Decreased balance       PT Treatment Interventions DME instruction;Therapeutic activities;Gait training;Therapeutic exercise;Patient/family education;Stair training;Balance training;Functional mobility training    PT Goals (Current goals can be found in the Care Plan section)  Acute Rehab PT Goals Patient Stated Goal: To return to walking distances without use of SPC. PT Goal Formulation: With patient Time For Goal Achievement: 01/14/19 Potential to Achieve Goals: Good    Frequency Min 2X/week   Barriers to discharge        Co-evaluation               AM-PAC PT "6 Clicks" Mobility  Outcome  Measure Help needed turning from your back to your side while in a flat bed without using bedrails?: None Help needed moving from lying on your back to sitting on the side of a flat bed without using bedrails?: None Help needed moving to and from a bed to a chair (including a wheelchair)?: None Help needed standing up from a chair using your arms (e.g., wheelchair or bedside chair)?: A Little Help needed to walk in hospital room?: A Little Help needed climbing 3-5 steps with a railing? : A Lot 6 Click Score: 20    End of Session Equipment Utilized During Treatment: Oxygen(2.5 L) Activity Tolerance: Other (comment)(Limited by SOB and O2 desat.) Patient left: in bed;with nursing/sitter in room;with family/visitor present;with call bell/phone within reach Nurse Communication: Mobility status PT Visit Diagnosis: Unsteadiness on feet (R26.81);Muscle weakness (generalized) (M62.81)    Time: 1119-1130 PT Time Calculation (min) (ACUTE ONLY): 11 min   Charges:   PT Evaluation $PT Eval Low Complexity: 1 Low          Roxanne Gates, PT, DPT   Roxanne Gates 01/06/2019, 11:35 AM

## 2019-01-06 NOTE — Progress Notes (Signed)
[place] at [place] NAME: Terri Hughes    MR#:  [id number]  DATE OF BIRTH:  1960-08-25  SUBJECTIVE:  CHIEF COMPLAINT:   Chief Complaint  Patient presents with  . Fever  . Cough   Patient seen and evaluated today Sitting up on the chair Has shortness of breath and wheezing On oxygen via nasal cannula at 2 L No fever and chills No chest pain  REVIEW OF SYSTEMS:  Review of Systems  Constitutional: Negative for chills and fever.  HENT: Negative for hearing loss and tinnitus.   Eyes: Negative for blurred vision and double vision.  Respiratory:       Shortness of breath and cough with wheezing improving  Cardiovascular: Negative for chest pain and orthopnea.  Gastrointestinal: Negative for heartburn and nausea.  Genitourinary: Negative for dysuria and urgency.  Musculoskeletal: Negative for myalgias and neck pain.  Skin: Negative for itching and rash.  Neurological: Negative for dizziness and headaches.  Psychiatric/Behavioral: Negative for depression and substance abuse.    DRUG ALLERGIES:   Allergies  Allergen Reactions  . Codeine Nausea And Vomiting   VITALS:  Blood pressure 115/60, pulse 69, temperature (!) 97.5 F (36.4 C), temperature source Oral, resp. rate 20, height 5\' 2"  (1.575 m), weight 111.6 kg, SpO2 97 %. PHYSICAL EXAMINATION:   Physical Exam  Constitutional: She is oriented to person, place, and time and well-developed, well-nourished, and in no distress.  HENT:  Head: Normocephalic and atraumatic.  Right Ear: External ear normal.  Eyes: Pupils are equal, round, and reactive to light. Conjunctivae are normal. Right eye exhibits no discharge.  Neck: Normal range of motion. Neck supple. No thyromegaly present.  Cardiovascular: Normal rate, regular rhythm and normal heart sounds.  Pulmonary/Chest: Effort normal. No respiratory distress. She has wheezes.  Abdominal: Soft. Bowel sounds are normal. She exhibits  no distension.  Musculoskeletal: Normal range of motion.        General: No edema.  Neurological: She is alert and oriented to person, place, and time. No cranial nerve deficit.  Skin: Skin is warm. She is not diaphoretic. No erythema.  Psychiatric: Affect and judgment normal.   LABORATORY PANEL:  Female CBC Recent Labs  Lab 01/03/19 2033  WBC 11.1*  HGB 14.5  HCT 46.6*  PLT 202   ------------------------------------------------------------------------------------------------------------------ Chemistries  Recent Labs  Lab 01/03/19 2033 01/05/19 0640 01/06/19 0721  NA 140 140 139  K 4.2 4.9 4.6  CL 106 104 103  CO2 22 22 28   GLUCOSE 109* 176* 175*  BUN 10 18 20   CREATININE 0.70 0.91 0.76  CALCIUM 8.9 9.2 9.2  MG  --  2.4  --   AST 22  --   --   ALT 24  --   --   ALKPHOS 93  --   --   BILITOT 0.6  --   --    RADIOLOGY:  No results found. ASSESSMENT AND PLAN:   This is a 58 year old female admitted for COPD exacerbation.  1.  Acute hypoxic Respiratory failure secondary to COPD exacerbation: Improving Management of COPD as outlined below  Supplemental oxygen.  Oxygen saturation of 95% on 2 L.  Patient does not use oxygen at home. Home oxygen therapy evaluation prior to discharge  2.  COPD: With exacerbation; Placed on IV Solu-Medrol.  Empiric antibiotics with azithromycin.  Nebulizer treatments.  As needed Robitussin supplemental oxygen.  Monitor clinically.  Add incentive spirometry  3.  Morbid  obesity: BMI is 42; encourage healthy diet and exercise  4. DVT prophylaxis: Lovenox  5 ambulatory dysfunction Physical therapy evaluation  All the records are reviewed and case discussed with Care Management/Social Worker. Management plans discussed with the patient, and she is in agreement.  CODE STATUS: Full Code  TOTAL TIME TAKING CARE OF THIS PATIENT: 35 minutes.   More than 50% of the time was spent in counseling/coordination of care: YES  POSSIBLE D/C IN  2 DAYS, DEPENDING ON CLINICAL CONDITION.   Saundra Shelling M.D on 01/06/2019 at 10:19 AM  Between 7am to 6pm - Pager - [phone]  After 6pm go to [email or web address] - Proofreader  [facility]  Office  [phone]  CC: Primary care physician; Kerri Perches, PA-C  Note: This dictation was prepared with Dragon dictation along with smaller phrase technology. Any transcriptional errors that result from this process are unintentional.

## 2019-01-07 DIAGNOSIS — R0602 Shortness of breath: Secondary | ICD-10-CM

## 2019-01-07 DIAGNOSIS — R0902 Hypoxemia: Secondary | ICD-10-CM

## 2019-01-07 DIAGNOSIS — J441 Chronic obstructive pulmonary disease with (acute) exacerbation: Secondary | ICD-10-CM

## 2019-01-07 NOTE — Progress Notes (Signed)
Progress Note    Terri Hughes  [id number] DOB: Jul 08, 1960  DOA: 01/03/2019 PCP: Kerri Perches, PA-C    Brief Narrative:    Medical records reviewed and are as summarized below:  Terri Hughes is an 58 y.o. female admitted to the hospital for COPD exacerbation.  Assessment/Plan:   Active Problems:   Acute on chronic respiratory failure with hypoxemia (HCC)   Body mass index is 45 kg/m.   Acute hypoxemic respiratory failure: Improving.  Oxygen saturation dropped to 89% with mild activity today.  COPD exacerbation: Continue IV Solu-Medrol and bronchodilators.  Morbid obesity: BMI is 42.  She has been advised to exercise regularly, eat healthy and lose weight  Tobacco use disorder: She said she quit smoking just prior to admission when she got sick.   Family Communication/Anticipated D/C date and plan/Code Status   DVT prophylaxis: Lovenox ordered. Code Status: Full Code.  Family Communication:  Disposition Plan: Possible discharge home tomorrow   Medical Consultants:    None.   Anti-Infectives:    Azithromycin  Subjective:   Complains of shortness of breath with mild exertion and even with walking to the bathroom.  She also has coughing spells with mild activity.  She has some wheezing but no chest pain.  Objective:    Vitals:   01/06/19 1414 01/06/19 2040 01/07/19 0555 01/07/19 1300  BP: 137/86 122/75 133/71 138/75  Pulse: 68 64 60 80  Resp:  18 16 20   Temp: 98.3 F (36.8 C) 97.9 F (36.6 C) 98.2 F (36.8 C) 98.2 F (36.8 C)  TempSrc: Oral Oral Oral Oral  SpO2: 98% 94% 97% 95%  Weight:      Height:       No intake or output data in the 24 hours ending 01/07/19 1612 Filed Weights   01/03/19 2027 01/05/19 0746 01/06/19 0436  Weight: 104.3 kg 108.1 kg 111.6 kg    Exam:  General Appearance:    Alert, cooperative, no distress, appears stated age  Lungs:    Bilateral expiratory wheezing. no rales heard.  Respirations unlabored   Heart:    Regular rate and rhythm, S1 and S2 normal, no murmur, rub   or gallop  Abdomen:     Soft, non-tender, bowel sounds active all four quadrants,    no masses palpable  Extremities:   Extremities normal, atraumatic, no cyanosis or edema  Pulses:   2+ and symmetric all extremities     Data Reviewed:   I have personally reviewed following labs and imaging studies:  Labs: Labs show the following:   Basic Metabolic Panel: Recent Labs  Lab 01/03/19 2033 01/05/19 0640 01/06/19 0721  NA 140 140 139  K 4.2 4.9 4.6  CL 106 104 103  CO2 22 22 28   GLUCOSE 109* 176* 175*  BUN 10 18 20   CREATININE 0.70 0.91 0.76  CALCIUM 8.9 9.2 9.2  MG  --  2.4  --    GFR Estimated Creatinine Clearance: 90.4 mL/min (by C-G formula based on SCr of 0.76 mg/dL). Liver Function Tests: Recent Labs  Lab 01/03/19 2033  AST 22  ALT 24  ALKPHOS 93  BILITOT 0.6  PROT 7.3  ALBUMIN 3.9   No results for input(s): LIPASE, AMYLASE in the last 168 hours. No results for input(s): AMMONIA in the last 168 hours. Coagulation profile No results for input(s): INR, PROTIME in the last 168 hours.  CBC: Recent Labs  Lab 01/03/19 2033  WBC 11.1*  HGB  14.5  HCT 46.6*  MCV 94.5  PLT 202   Cardiac Enzymes: No results for input(s): CKTOTAL, CKMB, CKMBINDEX, TROPONINI in the last 168 hours. BNP (last 3 results) No results for input(s): PROBNP in the last 8760 hours. CBG: No results for input(s): GLUCAP in the last 168 hours. D-Dimer: No results for input(s): DDIMER in the last 72 hours. Hgb A1c: No results for input(s): HGBA1C in the last 72 hours. Lipid Profile: No results for input(s): CHOL, HDL, LDLCALC, TRIG, CHOLHDL, LDLDIRECT in the last 72 hours. Thyroid function studies: No results for input(s): TSH, T4TOTAL, T3FREE, THYROIDAB in the last 72 hours.  Invalid input(s): FREET3 Anemia work up: No results for input(s): VITAMINB12, FOLATE, FERRITIN, TIBC, IRON, RETICCTPCT in the last 72  hours. Sepsis Labs: Recent Labs  Lab 01/03/19 2033  WBC 11.1*    Microbiology Recent Results (from the past 240 hour(s))  Novel Coronavirus, NAA ([facility])     Status: None   Collection Time: 01/03/19 10:00 AM   Specimen: Oropharyngeal(OP) collection in vial transport medium   OROPHARYNGEA  TESTING  Result Value Ref Range Status   SARS-CoV-2, NAA Not Detected Not Detected Final    Comment: This nucleic acid amplification test was developed and its performance characteristics determined by [employer]. Nucleic acid amplification tests include PCR and TMA. This test has not been FDA cleared or approved. This test has been authorized by FDA under an Emergency Use Authorization (EUA). This test is only authorized for the duration of time the declaration that circumstances exist justifying the authorization of the emergency use of in vitro diagnostic tests for detection of SARS-CoV-2 virus and/or diagnosis of COVID-19 infection under section 564(b)(1) of the Act, 21 U.S.C. [id number]) (1), unless the authorization is terminated or revoked sooner. When diagnostic testing is negative, the possibility of a false negative result should be considered in the context of a patient's recent exposures and the presence of clinical signs and symptoms consistent with COVID-19. An individual without symptoms of COVID-19 and who is not shedding SARS-CoV-2 virus would  expect to have a negative (not detected) result in this assay.   SARS Coronavirus 2 by RT PCR (hospital order, performed in [facility] hospital lab) Nasopharyngeal Nasopharyngeal Swab     Status: None   Collection Time: 01/04/19 12:42 AM   Specimen: Nasopharyngeal Swab  Result Value Ref Range Status   SARS Coronavirus 2 NEGATIVE NEGATIVE Final    Comment: (NOTE) If result is NEGATIVE SARS-CoV-2 target nucleic acids are NOT DETECTED. The SARS-CoV-2 RNA is generally detectable in upper and lower  respiratory specimens  during the acute phase of infection. The lowest  concentration of SARS-CoV-2 viral copies this assay can detect is 250  copies / mL. A negative result does not preclude SARS-CoV-2 infection  and should not be used as the sole basis for treatment or other  patient management decisions.  A negative result may occur with  improper specimen collection / handling, submission of specimen other  than nasopharyngeal swab, presence of viral mutation(s) within the  areas targeted by this assay, and inadequate number of viral copies  (<250 copies / mL). A negative result must be combined with clinical  observations, patient history, and epidemiological information. If result is POSITIVE SARS-CoV-2 target nucleic acids are DETECTED. The SARS-CoV-2 RNA is generally detectable in upper and lower  respiratory specimens dur ing the acute phase of infection.  Positive  results are indicative of active infection with SARS-CoV-2.  Clinical  correlation with  patient history and other diagnostic information is  necessary to determine patient infection status.  Positive results do  not rule out bacterial infection or co-infection with other viruses. If result is PRESUMPTIVE POSTIVE SARS-CoV-2 nucleic acids MAY BE PRESENT.   A presumptive positive result was obtained on the submitted specimen  and confirmed on repeat testing.  While 2019 novel coronavirus  (SARS-CoV-2) nucleic acids may be present in the submitted sample  additional confirmatory testing may be necessary for epidemiological  and / or clinical management purposes  to differentiate between  SARS-CoV-2 and other Sarbecovirus currently known to infect humans.  If clinically indicated additional testing with an alternate test  methodology [phone]) is advised. The SARS-CoV-2 RNA is generally  detectable in upper and lower respiratory sp ecimens during the acute  phase of infection. The expected result is Negative. Fact Sheet for Patients:   StrictlyIdeas.no Fact Sheet for Healthcare Providers: BankingDealers.co.za This test is not yet approved or cleared by the [place] FDA and has been authorized for detection and/or diagnosis of SARS-CoV-2 by FDA under an Emergency Use Authorization (EUA).  This EUA will remain in effect (meaning this test can be used) for the duration of the COVID-19 declaration under Section 564(b)(1) of the Act, 21 U.S.C. section 360bbb-3(b)(1), unless the authorization is terminated or revoked sooner. Performed at [facility], [address] [place], [place] [address]     Procedures and diagnostic studies:  No results found.  Medications:   . azithromycin  250 mg Oral Daily  . docusate sodium  100 mg Oral BID  . enoxaparin (LOVENOX) injection  40 mg Subcutaneous Q12H  . methylPREDNISolone (SOLU-MEDROL) injection  40 mg Intravenous Q12H  . mometasone-formoterol  2 puff Inhalation BID  . montelukast  10 mg Oral QHS  . pantoprazole  40 mg Oral Daily   Continuous Infusions:   LOS: 3 days   Stephaun Million  [facility] Pager [phone].   *Please refer to [email or web address], password [email or web address] to get updated schedule on who will round on this patient, as hospitalists switch teams weekly. If 7PM-7AM, please contact night-coverage at [email or web address], password [email or web address] for any overnight needs.  01/07/2019, 4:12 PM

## 2019-01-08 MED ORDER — PREDNISONE 20 MG PO TABS: 20.0000 mg | ORAL_TABLET | Freq: Every day | ORAL | 0 refills | Status: AC

## 2019-01-08 NOTE — Progress Notes (Signed)
New referral for [facility] community [facility]iative program to follow at home received from [place]. Plan is for discharge home today with [facility] following. Patient information given to referral. Flo Shanks BSN, RN, [place] [phone]

## 2019-01-08 NOTE — TOC Initial Note (Signed)
Transition of Care [facility]) - Initial/Assessment Note    Patient Details  Name: Terri Hughes MRN: [id number] Date of Birth: 1961-03-01  Transition of Care [facility]) CM/SW Contact:    Beverly Sessions, RN Phone Number: 01/08/2019, 2:10 PM  Clinical Narrative:                 Patient admitted for acute respiratory failure  Patient lives at home with her 58 year old granddaughter.   Patient states at baseline she is able to drive her self, however her daughter Anderson [place] lives locally and is able to transport her if she is unable to  [facility]  Patient to discharge with rx for prednisone.  RNCM provided goodrx coupon out of pocket cost $4.29 patient confirms she will be able to afford at discharge.   PT has assessed patient and recommends home health PT.  Patient in agreemend MD has ordered home health RN.  Requested for MD to add PT and [place] is on for charity rotation.  Referral made to [facility] with [facility] referral made to Santiago Glad with [employer] with [employer] to deliver cane to room  Expected Discharge Plan: [place] Barriers to Discharge: Barriers Resolved   Patient Goals and CMS Choice        Expected Discharge Plan and Services Expected Discharge Plan: [place]       Living arrangements for the past 2 months: Single Family Home Expected Discharge Date: 01/08/19                                    Prior Living Arrangements/Services Living arrangements for the past 2 months: Single Family Home Lives with:: Minor Children Patient language and need for interpreter reviewed:: Yes Do you feel safe going back to the place where you live?: Yes      Need for Family Participation in Patient Care: Yes (Comment) Care giver support system in place?: Yes (comment) Current home services: DME Criminal Activity/Legal Involvement Pertinent to Current  Situation/Hospitalization: No - Comment as needed  Activities of Daily Living Home Assistive Devices/Equipment: Nebulizer ADL Screening (condition at time of admission) Patient's cognitive ability adequate to safely complete daily activities?: Yes Is the patient deaf or have difficulty hearing?: No Does the patient have difficulty seeing, even when wearing glasses/contacts?: No Does the patient have difficulty concentrating, remembering, or making decisions?: No Patient able to express need for assistance with ADLs?: No Does the patient have difficulty dressing or bathing?: No Independently performs ADLs?: Yes (appropriate for developmental age) Does the patient have difficulty walking or climbing stairs?: Yes Weakness of Legs: None Weakness of Arms/Hands: None  Permission Sought/Granted                  Emotional Assessment Appearance:: Appears stated age Attitude/Demeanor/Rapport: Gracious Affect (typically observed): Accepting Orientation: : Oriented to Self, Oriented to Place, Oriented to  Time, Oriented to Situation   Psych Involvement: No (comment)  Admission diagnosis:  SOB (shortness of breath) [R06.02] Hypoxia [R09.02] Chronic obstructive pulmonary disease, unspecified COPD type ([place]) [J44.9] Patient Active Problem List   Diagnosis Date Noted  . Hypoxia   . SOB (shortness of breath)   . COPD with acute exacerbation ([place])   . Acute on chronic respiratory failure with hypoxemia ([place]) 01/04/2019   PCP:  Kerri Perches,  PA-C [id number] [place] - [address] [place] [address] Phone: [phone] Fax: [phone]     Social Determinants of Health (SDOH) Interventions    Readmission Risk Interventions No flowsheet data found.

## 2019-01-08 NOTE — Discharge Summary (Signed)
Physician Discharge Summary  Terri Hughes [id number] DOB: 20-Jan-1961 DOA: 01/03/2019  PCP: Kerri Perches, PA-C  Admit date: 01/03/2019 Discharge date: 01/10/2019  Admitted From: Home Discharge disposition: Home   Recommendations for Outpatient Follow-Up:   Outpatient follow up with PCP in 1 week.      Discharge Diagnosis:   Active Problems:   Acute on chronic respiratory failure with hypoxemia (HCC)   Hypoxia   SOB (shortness of breath)   COPD with acute exacerbation (HCC)    Discharge Condition: Stable.  Diet recommendation: Low sodium, heart healthy.  Carbohydrate-modified.  Regular.  Code status: Full code    Hospital Course:   Ms. Terri Hughes is a 58 year old woman with medical history significant for tobacco use disorder, morbid obesity, COPD, ? Asthma, ?CHF.  She presented to the hospital because of worsening shortness of breath and productive cough.  She was hypoxemic on admission.  She was admitted to the medical floor for acute hypoxemic respiratory failure and COPD exacerbation.  She was treated with IV steroids, bronchodilators and oxygen via nasal cannula.  Her condition has improved and she has been successfully weaned off of oxygen therapy.  She is deemed stable for discharge to home.  She has been advised to stop smoking cigarettes.    Medical Consultants:    None.   Discharge Exam:   Vitals:   01/08/19 0609 01/08/19 1351  BP: (!) 141/83 133/78  Pulse: 72 77  Resp: 18 15  Temp: 98.2 F (36.8 C) 98.2 F (36.8 C)  SpO2: 92% 92%   Vitals:   01/07/19 1300 01/07/19 2057 01/08/19 0609 01/08/19 1351  BP: 138/75 130/81 (!) 141/83 133/78  Pulse: 80 62 72 77  Resp: 20 18 18 15   Temp: 98.2 F (36.8 C) 98.2 F (36.8 C) 98.2 F (36.8 C) 98.2 F (36.8 C)  TempSrc: Oral Oral Oral Oral  SpO2: 95% 92% 92% 92%  Weight:      Height:         GEN: NAD SKIN: No rash EYES: EOMI ENT: MMM CV: RRR PULM: CTA B ABD: soft, obese, NT, +BS CNS: AAO  x 3, non focal EXT: No edema or tenderness   The results of significant diagnostics from this hospitalization (including imaging, microbiology, ancillary and laboratory) are listed below for reference.     Procedures and Diagnostic Studies:   Ct Angio Chest Pe W And/or Wo Contrast  Result Date: 01/04/2019 CLINICAL DATA:  Cough and fever. Shortness of breath EXAM: CT ANGIOGRAPHY CHEST WITH CONTRAST TECHNIQUE: Multidetector CT imaging of the chest was performed using the standard protocol during bolus administration of intravenous contrast. Multiplanar CT image reconstructions and MIPs were obtained to evaluate the vascular anatomy. CONTRAST:  22mL OMNIPAQUE IOHEXOL 350 MG/ML SOLN COMPARISON:  None. FINDINGS: Cardiovascular: --Pulmonary arteries: Contrast injection is sufficient to demonstrate satisfactory opacification of the pulmonary arteries to the segmental level, with attenuation of at least 200 HU at the main pulmonary artery. There is no pulmonary embolus. The main pulmonary artery is within normal limits for size. --Aorta: Satisfactory opacification of the thoracic aorta. No aortic dissection or other acute aortic syndrome. Conventional 3 vessel aortic branching pattern. There is no aortic atherosclerosis. --Heart: Normal size. No pericardial effusion. Mediastinum/Nodes: No mediastinal, hilar or axillary lymphadenopathy. The visualized thyroid and thoracic esophageal course are unremarkable. Lungs/Pleura: No pulmonary nodules or masses. No pleural effusion or pneumothorax. No focal airspace consolidation. No focal pleural abnormality. Upper Abdomen: Contrast bolus timing is not optimized for evaluation  of the abdominal organs. Small hiatal hernia. Musculoskeletal: No chest wall abnormality. No bony spinal canal stenosis. Review of the MIP images confirms the above findings. IMPRESSION: No pulmonary embolus or other acute thoracic abnormality. Electronically Signed   By: Ulyses Jarred M.D.   On:  01/04/2019 02:33   Dg Chest Port 1 View  Result Date: 01/03/2019 CLINICAL DATA:  Cough, congestion and fever EXAM: PORTABLE CHEST 1 VIEW COMPARISON:  Radiograph August 27, 2016 FINDINGS: Coarse basilar interstitial changes and airways thickening are similar to comparisons. No consolidation, features of edema, pneumothorax, or effusion. The cardiomediastinal contours are unremarkable. No acute osseous abnormality. Degenerative changes are present in the imaged shoulders. IMPRESSION: Chronic bronchitic and interstitial changes. No acute cardiopulmonary abnormality. Electronically Signed   By: Lovena Le M.D.   On: 01/03/2019 23:14     Labs:   Basic Metabolic Panel: Recent Labs  Lab 01/03/19 2033 01/05/19 0640 01/06/19 0721  NA 140 140 139  K 4.2 4.9 4.6  CL 106 104 103  CO2 22 22 28   GLUCOSE 109* 176* 175*  BUN 10 18 20   CREATININE 0.70 0.91 0.76  CALCIUM 8.9 9.2 9.2  MG  --  2.4  --    GFR Estimated Creatinine Clearance: 90.4 mL/min (by C-G formula based on SCr of 0.76 mg/dL). Liver Function Tests: Recent Labs  Lab 01/03/19 2033  AST 22  ALT 24  ALKPHOS 93  BILITOT 0.6  PROT 7.3  ALBUMIN 3.9   No results for input(s): LIPASE, AMYLASE in the last 168 hours. No results for input(s): AMMONIA in the last 168 hours. Coagulation profile No results for input(s): INR, PROTIME in the last 168 hours.  CBC: Recent Labs  Lab 01/03/19 2033  WBC 11.1*  HGB 14.5  HCT 46.6*  MCV 94.5  PLT 202   Cardiac Enzymes: No results for input(s): CKTOTAL, CKMB, CKMBINDEX, TROPONINI in the last 168 hours. BNP: Invalid input(s): POCBNP CBG: No results for input(s): GLUCAP in the last 168 hours. D-Dimer No results for input(s): DDIMER in the last 72 hours. Hgb A1c No results for input(s): HGBA1C in the last 72 hours. Lipid Profile No results for input(s): CHOL, HDL, LDLCALC, TRIG, CHOLHDL, LDLDIRECT in the last 72 hours. Thyroid function studies No results for input(s): TSH,  T4TOTAL, T3FREE, THYROIDAB in the last 72 hours.  Invalid input(s): FREET3 Anemia work up No results for input(s): VITAMINB12, FOLATE, FERRITIN, TIBC, IRON, RETICCTPCT in the last 72 hours. Microbiology Recent Results (from the past 240 hour(s))  Novel Coronavirus, NAA ([facility])     Status: None   Collection Time: 01/03/19 10:00 AM   Specimen: Oropharyngeal(OP) collection in vial transport medium   OROPHARYNGEA  TESTING  Result Value Ref Range Status   SARS-CoV-2, NAA Not Detected Not Detected Final    Comment: This nucleic acid amplification test was developed and its performance characteristics determined by [employer]. Nucleic acid amplification tests include PCR and TMA. This test has not been FDA cleared or approved. This test has been authorized by FDA under an Emergency Use Authorization (EUA). This test is only authorized for the duration of time the declaration that circumstances exist justifying the authorization of the emergency use of in vitro diagnostic tests for detection of SARS-CoV-2 virus and/or diagnosis of COVID-19 infection under section 564(b)(1) of the Act, 21 U.S.C. [id number]) (1), unless the authorization is terminated or revoked sooner. When diagnostic testing is negative, the possibility of a false negative result should be considered in the context  of a patient's recent exposures and the presence of clinical signs and symptoms consistent with COVID-19. An individual without symptoms of COVID-19 and who is not shedding SARS-CoV-2 virus would  expect to have a negative (not detected) result in this assay.   SARS Coronavirus 2 by RT PCR (hospital order, performed in [facility] hospital lab) Nasopharyngeal Nasopharyngeal Swab     Status: None   Collection Time: 01/04/19 12:42 AM   Specimen: Nasopharyngeal Swab  Result Value Ref Range Status   SARS Coronavirus 2 NEGATIVE NEGATIVE Final    Comment: (NOTE) If result is NEGATIVE SARS-CoV-2 target  nucleic acids are NOT DETECTED. The SARS-CoV-2 RNA is generally detectable in upper and lower  respiratory specimens during the acute phase of infection. The lowest  concentration of SARS-CoV-2 viral copies this assay can detect is 250  copies / mL. A negative result does not preclude SARS-CoV-2 infection  and should not be used as the sole basis for treatment or other  patient management decisions.  A negative result may occur with  improper specimen collection / handling, submission of specimen other  than nasopharyngeal swab, presence of viral mutation(s) within the  areas targeted by this assay, and inadequate number of viral copies  (<250 copies / mL). A negative result must be combined with clinical  observations, patient history, and epidemiological information. If result is POSITIVE SARS-CoV-2 target nucleic acids are DETECTED. The SARS-CoV-2 RNA is generally detectable in upper and lower  respiratory specimens dur ing the acute phase of infection.  Positive  results are indicative of active infection with SARS-CoV-2.  Clinical  correlation with patient history and other diagnostic information is  necessary to determine patient infection status.  Positive results do  not rule out bacterial infection or co-infection with other viruses. If result is PRESUMPTIVE POSTIVE SARS-CoV-2 nucleic acids MAY BE PRESENT.   A presumptive positive result was obtained on the submitted specimen  and confirmed on repeat testing.  While 2019 novel coronavirus  (SARS-CoV-2) nucleic acids may be present in the submitted sample  additional confirmatory testing may be necessary for epidemiological  and / or clinical management purposes  to differentiate between  SARS-CoV-2 and other Sarbecovirus currently known to infect humans.  If clinically indicated additional testing with an alternate test  methodology [phone]) is advised. The SARS-CoV-2 RNA is generally  detectable in upper and lower  respiratory sp ecimens during the acute  phase of infection. The expected result is Negative. Fact Sheet for Patients:  StrictlyIdeas.no Fact Sheet for Healthcare Providers: BankingDealers.co.za This test is not yet approved or cleared by the [place] FDA and has been authorized for detection and/or diagnosis of SARS-CoV-2 by FDA under an Emergency Use Authorization (EUA).  This EUA will remain in effect (meaning this test can be used) for the duration of the COVID-19 declaration under Section 564(b)(1) of the Act, 21 U.S.C. section 360bbb-3(b)(1), unless the authorization is terminated or revoked sooner. Performed at [facility], [address] [place], [place] [address]      Discharge Instructions:   Discharge Instructions    Amb Referral to Palliative Care   Complete by: As directed    Diet - low sodium heart healthy   Complete by: As directed    Face-to-face encounter (required for Medicare/Medicaid patients)   Complete by: As directed    I [place] certify that this patient is under my care and that I, or a nurse practitioner or physician's assistant working with me, had  a face-to-face encounter that meets the physician face-to-face encounter requirements with this patient on 01/08/2019. The encounter with the patient was in whole, or in part for the following medical condition(s) which is the primary reason for home health care (List medical condition): COPD   The encounter with the patient was in whole, or in part, for the following medical condition, which is the primary reason for home health care: COPD   I certify that, based on my findings, the following services are medically necessary home health services: Nursing   Reason for Medically Necessary Home Health Services: Skilled Nursing- Change/Decline in Patient Status   My clinical findings support the need for the above services: Shortness of breath with  activity   Further, I certify that my clinical findings support that this patient is homebound due to: Shortness of Breath with activity   Home Health   Complete by: As directed    To provide the following care/treatments:  RN PT Social work     Increase activity slowly   Complete by: As directed      Allergies as of 01/08/2019      Reactions   Codeine Nausea And Vomiting      Medication List    TAKE these medications   albuterol 0.63 MG/3ML nebulizer solution Commonly known as: ACCUNEB Take 1 ampule by nebulization every 6 (six) hours as needed for wheezing.   albuterol 108 (90 Base) MCG/ACT inhaler Commonly known as: VENTOLIN HFA Inhale 1-2 puffs into the lungs every 4 (four) hours as needed for wheezing or shortness of breath.   montelukast 10 MG tablet Commonly known as: SINGULAIR Take 10 mg by mouth at bedtime.   omeprazole 20 MG capsule Commonly known as: PRILOSEC Take 20 mg by mouth daily.   predniSONE 20 MG tablet Commonly known as: Deltasone Take 1 tablet (20 mg total) by mouth daily for 2 days.   Symbicort 160-4.5 MCG/ACT inhaler Generic drug: budesonide-formoterol Inhale 2 puffs into the lungs daily.      Follow-up [place]. Go on 01/16/2019.   Specialty: General Practice Why: 10 am appointment Contact information: [address] [place] [address] [phone]            Time coordinating discharge: 28 minutes  Signed:  Jennye Boroughs  Pager [phone] [facility] 01/10/2019, 6:54 AM

## 2019-01-24 ENCOUNTER — Institutional Professional Consult (permissible substitution): Payer: Medicaid Other | Admitting: Pulmonary Disease

## 2019-02-02 ENCOUNTER — Telehealth: Payer: Medicaid Other | Admitting: Family

## 2019-02-02 DIAGNOSIS — A499 Bacterial infection, unspecified: Secondary | ICD-10-CM

## 2019-02-02 DIAGNOSIS — N390 Urinary tract infection, site not specified: Secondary | ICD-10-CM

## 2019-02-02 MED ORDER — NITROFURANTOIN MONOHYD MACRO 100 MG PO CAPS: 100.0000 mg | ORAL_CAPSULE | Freq: Two times a day (BID) | ORAL | 0 refills | Status: DC

## 2019-02-02 NOTE — Progress Notes (Signed)
E-Visit fo.r Corona Virus ScreeniWe are sorry that you are not feeling well.  Here is how we plan to help!  Based on what you shared with me it looks like you most likely have a simple urinary tract infection.  A UTI (Urinary Tract Infection) is a bacterial infection of the bladder.  Most cases of urinary tract infections are simple to treat but a key part of your care is to encourage you to drink plenty of fluids and watch your symptoms carefully.  I have prescribed MacroBid 100 mg twice a day for 5 days.  Your symptoms should gradually improve. Call us if the burning in your urine worsens, you develop worsening fever, back pain or pelvic pain or if your symptoms do not resolve after completing the antibiotic.  Urinary tract infections can be prevented by drinking plenty of water to keep your body hydrated.  Also be sure when you wipe, wipe from front to back and don't hold it in!  If possible, empty your bladder every 4 hours.  Your e-visit answers were reviewed by a board certified advanced clinical practitioner to complete your personal care plan.  Depending on the condition, your plan could have included both over the counter or prescription medications.  If there is a problem please reply  once you have received a response from your provider.  Your safety is important to [place].  If you have drug allergies check your prescription carefully.    You can use MyChart to ask questions about today's visit, request a non-urgent call back, or ask for a work or school excuse for 24 hours related to this e-Visit. If it has been greater than 24 hours you will need to follow up with your provider, or enter a new e-Visit to address those concerns.   You will get an e-mail in the next two days asking about your experience.  I hope that your e-visit has been valuable and will speed your recovery. Thank you for using e-visits.     Greater than 5 minutes, yet less than 10 minutes of time have been spent  researching, coordinating, and implementing care for this patient today.  Thank you for the details you included in the comment boxes. Those details are very helpful in determining the best course of treatment for you and help [place] to provide the best care.

## 2019-02-09 ENCOUNTER — Institutional Professional Consult (permissible substitution): Payer: Medicaid Other | Admitting: Pulmonary Disease

## 2019-02-20 DIAGNOSIS — Z87891 Personal history of nicotine dependence: Secondary | ICD-10-CM | POA: Insufficient documentation

## 2019-02-20 DIAGNOSIS — J302 Other seasonal allergic rhinitis: Secondary | ICD-10-CM | POA: Insufficient documentation

## 2019-03-16 ENCOUNTER — Telehealth: Payer: Medicaid Other | Admitting: Nurse Practitioner

## 2019-03-16 DIAGNOSIS — N390 Urinary tract infection, site not specified: Secondary | ICD-10-CM

## 2019-03-16 MED ORDER — NITROFURANTOIN MONOHYD MACRO 100 MG PO CAPS: 100.0000 mg | ORAL_CAPSULE | Freq: Two times a day (BID) | ORAL | 0 refills | Status: DC

## 2019-03-16 NOTE — Progress Notes (Signed)
We are sorry that you are not feeling well.  Here is how we plan to help!  Based on what you shared with me it looks like you most likely have a simple urinary tract infection.  A UTI (Urinary Tract Infection) is a bacterial infection of the bladder.  Most cases of urinary tract infections are simple to treat but a key part of your care is to encourage you to drink plenty of fluids and watch your symptoms carefully.  I have prescribed MacroBid 100 mg twice a day for 5 days.  Your symptoms should gradually improve. Call us if the burning in your urine worsens, you develop worsening fever, back pain or pelvic pain or if your symptoms do not resolve after completing the antibiotic.  Urinary tract infections can be prevented by drinking plenty of water to keep your body hydrated.  Also be sure when you wipe, wipe from front to back and don't hold it in!  If possible, empty your bladder every 4 hours.  Your e-visit answers were reviewed by a board certified advanced clinical practitioner to complete your personal care plan.  Depending on the condition, your plan could have included both over the counter or prescription medications.  If there is a problem please reply  once you have received a response from your provider.  Your safety is important to us.  If you have drug allergies check your prescription carefully.    You can use MyChart to ask questions about today's visit, request a non-urgent call back, or ask for a work or school excuse for 24 hours related to this e-Visit. If it has been greater than 24 hours you will need to follow up with your provider, or enter a new e-Visit to address those concerns.   You will get an e-mail in the next two days asking about your experience.  I hope that your e-visit has been valuable and will speed your recovery. Thank you for using e-visits.  I have spent at least 5 minutes reviewing and documenting in the patient's chart.  

## 2019-03-21 ENCOUNTER — Encounter: Payer: Medicaid Other | Attending: Pulmonary Disease | Admitting: *Deleted

## 2019-03-21 ENCOUNTER — Other Ambulatory Visit: Payer: Self-pay

## 2019-03-21 DIAGNOSIS — J449 Chronic obstructive pulmonary disease, unspecified: Secondary | ICD-10-CM | POA: Insufficient documentation

## 2019-03-21 NOTE — Progress Notes (Addendum)
Completed virtual orientation today.  EP eval scheduled for 1/14 at [place] has recently quit tobacco use within the last 6 months. Intervention for relapse prevention was provided at the initial medical review. She was encouraged to continue to with tobacco cessation and was provided information on relapse prevention. Patient received information about combination therapy, tobacco cessation classes, quit line, and quit smoking apps in case of a relapse. Patient demonstrated understanding of this material.Staff will continue to provide encouragement and follow up with the patient throughout the program.

## 2019-03-22 ENCOUNTER — Other Ambulatory Visit: Payer: Self-pay

## 2019-03-22 ENCOUNTER — Encounter: Payer: Medicaid Other | Admitting: *Deleted

## 2019-03-22 VITALS — Ht 62.9 in | Wt 243.0 lb

## 2019-03-22 DIAGNOSIS — J449 Chronic obstructive pulmonary disease, unspecified: Secondary | ICD-10-CM

## 2019-03-22 NOTE — Progress Notes (Addendum)
Pulmonary Individual Treatment Plan  Patient Details  Name: Terri Hughes MRN: [id number] Date of Birth: 11/22/1960 Referring Provider:     Pulmonary Rehab from 03/22/2019 in [facility] Cardiac and Pulmonary Rehab  Referring Provider  Thad Ranger MD      Initial Encounter Date:    Pulmonary Rehab from 03/22/2019 in [facility] Cardiac and Pulmonary Rehab  Date  03/22/19      Visit Diagnosis: Chronic obstructive pulmonary disease, unspecified COPD type ([place])  Patient's Home Medications on Admission:  Current Outpatient Medications:  .  albuterol (ACCUNEB) 0.63 MG/3ML nebulizer solution, Take 1 ampule by nebulization every 6 (six) hours as needed for wheezing., Disp: , Rfl:  .  albuterol (VENTOLIN HFA) 108 (90 Base) MCG/ACT inhaler, Inhale 1-2 puffs into the lungs every 4 (four) hours as needed for wheezing or shortness of breath., Disp: , Rfl:  .  budesonide-formoterol (SYMBICORT) 160-4.5 MCG/ACT inhaler, Inhale 2 puffs into the lungs daily. , Disp: , Rfl:  .  montelukast (SINGULAIR) 10 MG tablet, Take 10 mg by mouth at bedtime. , Disp: , Rfl:  .  nitrofurantoin, macrocrystal-monohydrate, (MACROBID) 100 MG capsule, Take 1 capsule (100 mg total) by mouth 2 (two) times daily., Disp: 10 capsule, Rfl: 0 .  nitrofurantoin, macrocrystal-monohydrate, (MACROBID) 100 MG capsule, Take 1 capsule (100 mg total) by mouth 2 (two) times daily., Disp: 10 capsule, Rfl: 0 .  omeprazole (PRILOSEC) 20 MG capsule, Take 20 mg by mouth daily. , Disp: , Rfl:  .  tiotropium (SPIRIVA) 18 MCG inhalation capsule, Place into inhaler and inhale., Disp: , Rfl:   Past Medical History: Past Medical History:  Diagnosis Date  . Asthma   . CHF (congestive heart failure) (HCC)     Tobacco Use: Social History   Tobacco Use  Smoking Status Current Every Day Smoker  Smokeless Tobacco Never Used    Labs: Recent Review Flowsheet Data    There is no flowsheet data to display.       Pulmonary Assessment  Scores: Pulmonary Assessment Scores    Row Name 03/22/19 0907         ADL UCSD   ADL Phase  Entry     SOB Score total  86     Rest  0     Walk  3     Stairs  5     Bath  4     Dress  4     Shop  5       CAT Score   CAT Score  28       mMRC Score   mMRC Score  4        UCSD: Self-administered rating of dyspnea associated with activities of daily living (ADLs) 6-point scale (0 = "not at all" to 5 = "maximal or unable to do because of breathlessness")  Scoring Scores range from 0 to 120.  Minimally important difference is 5 units  CAT: CAT can identify the health impairment of COPD patients and is better correlated with disease progression.  CAT has a scoring range of zero to 40. The CAT score is classified into four groups of low (less than 10), medium (10 - 20), high (21-30) and very high (31-40) based on the impact level of disease on health status. A CAT score over 10 suggests significant symptoms.  A worsening CAT score could be explained by an exacerbation, poor medication adherence, poor inhaler technique, or progression of COPD or comorbid conditions.  CAT MCID is 2  points  mMRC: mMRC (Modified Medical Research Council) Dyspnea Scale is used to assess the degree of baseline functional disability in patients of respiratory disease due to dyspnea. No minimal important difference is established. A decrease in score of 1 point or greater is considered a positive change.   Pulmonary Function Assessment: Pulmonary Function Assessment - 03/22/19 0927      Initial Spirometry Results   FVC%  77 %   02/07/2019   FEV1%  53 %    FEV1/FVC Ratio  55      Post Bronchodilator Spirometry Results   FVC%  81 %    FEV1%  58 %    FEV1/FVC Ratio  57      Breath   Shortness of Breath  Yes;Fear of Shortness of Breath;Limiting activity;Panic with Shortness of Breath       Exercise Target Goals: Exercise Program Goal: Individual exercise prescription set using results from  initial 6 min walk test and THRR while considering  patient's activity barriers and safety.   Exercise Prescription Goal: Initial exercise prescription builds to 30-45 minutes a day of aerobic activity, 2-3 days per week.  Home exercise guidelines will be given to patient during program as part of exercise prescription that the participant will acknowledge.  Activity Barriers & Risk Stratification: Activity Barriers & Cardiac Risk Stratification - 03/21/19 1111      Activity Barriers & Cardiac Risk Stratification   Activity Barriers  Joint Problems;Other (comment);Deconditioning;Muscular Weakness;Shortness of Breath    Comments  occasional bilateral foot cramps on top of foot after being on feet for prolonged period of time (uses [employer] to treat pain)       6 Minute Walk: 6 Minute Walk    Row Name 03/22/19 0858         6 Minute Walk   Phase  Initial     Distance  1200 feet     Walk Time  6 minutes     # of Rest Breaks  0     MPH  2.27     METS  2.33     RPE  15     Perceived Dyspnea   4     VO2 Peak  8.14     Symptoms  Yes (comment)     Comments  dizzy (improved with rest), back pain 6/10, SOB     Resting HR  75 bpm     Resting BP  114/66     Resting Oxygen Saturation   96 %     Exercise Oxygen Saturation  during 6 min walk  87 %     Max Ex. HR  117 bpm     Max Ex. BP  144/70     2 Minute Post BP  136/72       Interval HR   1 Minute HR  100     2 Minute HR  110     3 Minute HR  116     4 Minute HR  117     5 Minute HR  115     6 Minute HR  109     2 Minute Post HR  79     Interval Heart Rate?  Yes       Interval Oxygen   Interval Oxygen?  Yes     Baseline Oxygen Saturation %  96 %     1 Minute Oxygen Saturation %  88 %     1 Minute Liters of Oxygen  0 L Room Air     2 Minute Oxygen Saturation %  87 %     2 Minute Liters of Oxygen  0 L     3 Minute Oxygen Saturation %  88 %     3 Minute Liters of Oxygen  0 L     4 Minute Oxygen Saturation %  89 %     4  Minute Liters of Oxygen  0 L     5 Minute Oxygen Saturation %  87 %     5 Minute Liters of Oxygen  0 L     6 Minute Oxygen Saturation %  88 %     6 Minute Liters of Oxygen  0 L     2 Minute Post Oxygen Saturation %  94 %     2 Minute Post Liters of Oxygen  0 L       Oxygen Initial Assessment: Oxygen Initial Assessment - 03/21/19 1116      Home Oxygen   Home Oxygen Device  None    Sleep Oxygen Prescription  CPAP   just completed sleep study   Home Exercise Oxygen Prescription  None    Home at Rest Exercise Oxygen Prescription  None    Compliance with Home Oxygen Use  Yes      Initial 6 min Walk   Oxygen Used  None      Program Oxygen Prescription   Program Oxygen Prescription  None      Intervention   Short Term Goals  To learn and exhibit compliance with exercise, home and travel O2 prescription;To learn and understand importance of monitoring SPO2 with pulse oximeter and demonstrate accurate use of the pulse oximeter.;To learn and understand importance of maintaining oxygen saturations>88%;To learn and demonstrate proper pursed lip breathing techniques or other breathing techniques.;To learn and demonstrate proper use of respiratory medications    Long  Term Goals  Exhibits compliance with exercise, home and travel O2 prescription;Verbalizes importance of monitoring SPO2 with pulse oximeter and return demonstration;Maintenance of O2 saturations>88%;Exhibits proper breathing techniques, such as pursed lip breathing or other method taught during program session;Compliance with respiratory medication;Demonstrates proper use of MDI's       Oxygen Re-Evaluation:   Oxygen Discharge (Final Oxygen Re-Evaluation):   Initial Exercise Prescription: Initial Exercise Prescription - 03/22/19 0900      Date of Initial Exercise RX and Referring Provider   Date  03/22/19    Referring Provider  Thad Ranger MD      Treadmill   MPH  1.7    Grade  0    Minutes  15    METs  2.3       NuStep   Level  1    SPM  80    Minutes  15    METs  2      Biostep-RELP   Level  1    SPM  50    Minutes  15    METs  2      Prescription Details   Frequency (times per week)  1    Duration  Progress to 30 minutes of continuous aerobic without signs/symptoms of physical distress      Intensity   THRR 40-80% of Max Heartrate  110-145    Ratings of Perceived Exertion  11-13    Perceived Dyspnea  0-4      Progression   Progression  Continue to progress workloads to maintain intensity without signs/symptoms of physical  distress.      Resistance Training   Training Prescription  Yes    Weight  3 lbs    Reps  10-15       Perform Capillary Blood Glucose checks as needed.  Exercise Prescription Changes:  Exercise Prescription Changes    Row Name 03/22/19 0900             Response to Exercise   Blood Pressure (Admit)  114/66       Blood Pressure (Exercise)  144/70       Blood Pressure (Exit)  124/70       Heart Rate (Admit)  75 bpm       Heart Rate (Exercise)  117 bpm       Heart Rate (Exit)  76 bpm       Oxygen Saturation (Admit)  96 %       Oxygen Saturation (Exercise)  87 %       Oxygen Saturation (Exit)  94 %       Rating of Perceived Exertion (Exercise)  15       Perceived Dyspnea (Exercise)  4       Symptoms  dizzy, back pain (6/10), SOB       Comments  walk test results          Exercise Comments:   Exercise Goals and Review:  Exercise Goals    Row Name 03/22/19 0904             Exercise Goals   Increase Physical Activity  Yes       Intervention  Provide advice, education, support and counseling about physical activity/exercise needs.;Develop an individualized exercise prescription for aerobic and resistive training based on initial evaluation findings, risk stratification, comorbidities and participant's personal goals.       Expected Outcomes  Short Term: Attend rehab on a regular basis to increase amount of physical activity.;Long Term:  Add in home exercise to make exercise part of routine and to increase amount of physical activity.;Long Term: Exercising regularly at least 3-5 days a week.       Increase Strength and Stamina  Yes       Intervention  Provide advice, education, support and counseling about physical activity/exercise needs.       Expected Outcomes  Short Term: Increase workloads from initial exercise prescription for resistance, speed, and METs.;Short Term: Perform resistance training exercises routinely during rehab and add in resistance training at home;Long Term: Improve cardiorespiratory fitness, muscular endurance and strength as measured by increased METs and functional capacity (6MWT)       Able to understand and use rate of perceived exertion (RPE) scale  Yes       Intervention  Provide education and explanation on how to use RPE scale       Expected Outcomes  Short Term: Able to use RPE daily in rehab to express subjective intensity level;Long Term:  Able to use RPE to guide intensity level when exercising independently       Able to understand and use Dyspnea scale  Yes       Intervention  Provide education and explanation on how to use Dyspnea scale       Expected Outcomes  Short Term: Able to use Dyspnea scale daily in rehab to express subjective sense of shortness of breath during exertion;Long Term: Able to use Dyspnea scale to guide intensity level when exercising independently       Knowledge and understanding of Target  Heart Rate Range (THRR)  Yes       Intervention  Provide education and explanation of THRR including how the numbers were predicted and where they are located for reference       Expected Outcomes  Short Term: Able to state/look up THRR;Short Term: Able to use daily as guideline for intensity in rehab;Long Term: Able to use THRR to govern intensity when exercising independently       Able to check pulse independently  Yes       Intervention  Provide education and demonstration on how to  check pulse in carotid and radial arteries.;Review the importance of being able to check your own pulse for safety during independent exercise       Expected Outcomes  Short Term: Able to explain why pulse checking is important during independent exercise;Long Term: Able to check pulse independently and accurately       Understanding of Exercise Prescription  Yes       Intervention  Provide education, explanation, and written materials on patient's individual exercise prescription       Expected Outcomes  Short Term: Able to explain program exercise prescription;Long Term: Able to explain home exercise prescription to exercise independently          Exercise Goals Re-Evaluation :   Discharge Exercise Prescription (Final Exercise Prescription Changes): Exercise Prescription Changes - 03/22/19 0900      Response to Exercise   Blood Pressure (Admit)  114/66    Blood Pressure (Exercise)  144/70    Blood Pressure (Exit)  124/70    Heart Rate (Admit)  75 bpm    Heart Rate (Exercise)  117 bpm    Heart Rate (Exit)  76 bpm    Oxygen Saturation (Admit)  96 %    Oxygen Saturation (Exercise)  87 %    Oxygen Saturation (Exit)  94 %    Rating of Perceived Exertion (Exercise)  15    Perceived Dyspnea (Exercise)  4    Symptoms  dizzy, back pain (6/10), SOB    Comments  walk test results       Nutrition:  Target Goals: Understanding of nutrition guidelines, daily intake of sodium <1543m, cholesterol <2063m calories 30% from fat and 7% or less from saturated fats, daily to have 5 or more servings of fruits and vegetables.  Biometrics: Pre Biometrics - 03/22/19 0905      Pre Biometrics   Height  5' 2.9" (1.598 m)    Weight  243 lb (110.2 kg)    BMI (Calculated)  43.16    Single Leg Stand  15.81 seconds        Nutrition Therapy Plan and Nutrition Goals:   Nutrition Assessments: Nutrition Assessments - 03/22/19 0905      MEDFICTS Scores   Pre Score  62       Nutrition Goals  Re-Evaluation:   Nutrition Goals Discharge (Final Nutrition Goals Re-Evaluation):   Psychosocial: Target Goals: Acknowledge presence or absence of significant depression and/or stress, maximize coping skills, provide positive support system. Participant is able to verbalize types and ability to use techniques and skills needed for reducing stress and depression.   Initial Review & Psychosocial Screening: Initial Psych Review & Screening - 03/21/19 1118      Initial Review   Current issues with  Current Depression;Current Anxiety/Panic;Current Stress Concerns    Source of Stress Concerns  Unable to participate in former interests or hobbies;Unable to perform yard/household activities;Retirement/disability;Financial;Chronic Illness  Comments  Currently applying for disability (first round was denied), has tried Xanax before, but not currently on it, anti-depressive make suicidal ideations worse, bowel and bladder incontenince (vaginal fault) and has appt with [facility]?  Yes   six children (three are local)   Comments  Three of her six children live in the area and has a granddaughter that lives with her to help out.      Barriers   Psychosocial barriers to participate in program  The patient should benefit from training in stress management and relaxation.;There are no identifiable barriers or psychosocial needs.      Screening Interventions   Interventions  Encouraged to exercise;Provide feedback about the scores to participant;To provide support and resources with identified psychosocial needs    Expected Outcomes  Long Term Goal: Stressors or current issues are controlled or eliminated.;Short Term goal: Identification and review with participant of any Quality of Life or Depression concerns found by scoring the questionnaire.;Long Term goal: The participant improves quality of Life and PHQ9 Scores as seen by post scores and/or verbalization of  changes       Quality of Life Scores:  Scores of 19 and below usually indicate a poorer quality of life in these areas.  A difference of  2-3 points is a clinically meaningful difference.  A difference of 2-3 points in the total score of the Quality of Life Index has been associated with significant improvement in overall quality of life, self-image, physical symptoms, and general health in studies assessing change in quality of life.  PHQ-9: Recent Review Flowsheet Data    Depression screen [facility] 2/9 03/22/2019   Decreased Interest 1   Down, Depressed, Hopeless 2   PHQ - 2 Score 3   Altered sleeping 3   Tired, decreased energy 3   Change in appetite 3   Feeling bad or failure about yourself  2   Trouble concentrating 3   Moving slowly or fidgety/restless 0   Suicidal thoughts 0   PHQ-9 Score 17   Difficult doing work/chores Very difficult     Interpretation of Total Score  Total Score Depression Severity:  1-4 = Minimal depression, 5-9 = Mild depression, 10-14 = Moderate depression, 15-19 = Moderately severe depression, 20-27 = Severe depression   Psychosocial Evaluation and Intervention: Psychosocial Evaluation - 03/21/19 1129      Psychosocial Evaluation & Interventions   Interventions  Stress management education;Encouraged to exercise with the program and follow exercise prescription    Comments  Baker Janus is coming to pulmonary rehab.  She is coming in for COPD and had a recent hospitalization for an exacerbation and hypoxia.  She has a significant history of depression and anxiety.  She feels that her depression is currently under control but her anxiety is starting to spiral.  In the past she was on xanax for anxiety and it worked and helped her sleep, but she was taken off of it.  She does not do well on anti-depression meds as they seem to make her suicidal ideations worse.  She is currently thinking about seeing someone at the [facility] for help with her anxiety.  She is also  hoping that the exercise will help as well.  She is looking forwad to the program.  She wants to learn to be comfortable with exercise and build her confidence back up.  She doesn't want to continue to worry about her heart and  sats dropping when she exercises.  She will benefit from exercise from the strength and stamina side as well.  She also has issues with bowel and urinary incontenince that ofter leads to infections.  She is planning to see her OB-GYN about this and is considering pelvic floor therapy as well.    Expected Outcomes  Short: Attend rehab regularly to build confidence in her ability to exercise.  Long: Better management of anxiety.    Continue Psychosocial Services   Follow up required by staff       Psychosocial Re-Evaluation:   Psychosocial Discharge (Final Psychosocial Re-Evaluation):   Education: Education Goals: Education classes will be provided on a weekly basis, covering required topics. Participant will state understanding/return demonstration of topics presented.  Learning Barriers/Preferences: Learning Barriers/Preferences - 03/21/19 1113      Learning Barriers/Preferences   Learning Barriers  Reading;Sight   occasional "brain fog", glasses   Learning Preferences  Written Material       Education Topics:  Initial Evaluation Education: - Verbal, written and demonstration of respiratory meds, oximetry and breathing techniques. Instruction on use of nebulizers and MDIs and importance of monitoring MDI activations.   Pulmonary Rehab from 03/22/2019 in [facility] Cardiac and Pulmonary Rehab  Date  03/22/19  Educator  [facility]  Instruction Review Code  1- Verbalizes Understanding      General Nutrition Guidelines/Fats and Fiber: -Group instruction provided by verbal, written material, models and posters to present the general guidelines for heart healthy nutrition. Gives an explanation and review of dietary fats and fiber.   Controlling Sodium/Reading Food  Labels: -Group verbal and written material supporting the discussion of sodium use in heart healthy nutrition. Review and explanation with models, verbal and written materials for utilization of the food label.   Exercise Physiology & General Exercise Guidelines: - Group verbal and written instruction with models to review the exercise physiology of the cardiovascular system and associated critical values. Provides general exercise guidelines with specific guidelines to those with heart or lung disease.    Aerobic Exercise & Resistance Training: - Gives group verbal and written instruction on the various components of exercise. Focuses on aerobic and resistive training programs and the benefits of this training and how to safely progress through these programs.   Flexibility, Balance, Mind/Body Relaxation: Provides group verbal/written instruction on the benefits of flexibility and balance training, including mind/body exercise modes such as yoga, pilates and tai chi.  Demonstration and skill practice provided.   Stress and Anxiety: - Provides group verbal and written instruction about the health risks of elevated stress and causes of high stress.  Discuss the correlation between heart/lung disease and anxiety and treatment options. Review healthy ways to manage with stress and anxiety.   Depression: - Provides group verbal and written instruction on the correlation between heart/lung disease and depressed mood, treatment options, and the stigmas associated with seeking treatment.   Exercise & Equipment Safety: - Individual verbal instruction and demonstration of equipment use and safety with use of the equipment.   Pulmonary Rehab from 03/22/2019 in [facility] Cardiac and Pulmonary Rehab  Date  03/22/19  Educator  [facility]  Instruction Review Code  1- Verbalizes Understanding      Infection Prevention: - Provides verbal and written material to individual with discussion of infection control  including proper hand washing and proper equipment cleaning during exercise session.   Pulmonary Rehab from 03/22/2019 in [facility] Cardiac and Pulmonary Rehab  Date  03/22/19  Educator  [facility]  Instruction Review Code  1- Verbalizes Understanding      Falls Prevention: - Provides verbal and written material to individual with discussion of falls prevention and safety.   Pulmonary Rehab from 03/22/2019 in [facility] Cardiac and Pulmonary Rehab  Date  03/22/19  Educator  [facility]  Instruction Review Code  1- Verbalizes Understanding      Diabetes: - Individual verbal and written instruction to review signs/symptoms of diabetes, desired ranges of glucose level fasting, after meals and with exercise. Advice that pre and post exercise glucose checks will be done for 3 sessions at entry of program.   Chronic Lung Diseases: - Group verbal and written instruction to review updates, respiratory medications, advancements in procedures and treatments. Discuss use of supplemental oxygen including available portable oxygen systems, continuous and intermittent flow rates, concentrators, personal use and safety guidelines. Review proper use of inhaler and spacers. Provide informative websites for self-education.    Energy Conservation: - Provide group verbal and written instruction for methods to conserve energy, plan and organize activities. Instruct on pacing techniques, use of adaptive equipment and posture/positioning to relieve shortness of breath.   Triggers and Exacerbations: - Group verbal and written instruction to review types of environmental triggers and ways to prevent exacerbations. Discuss weather changes, air quality and the benefits of nasal washing. Review warning signs and symptoms to help prevent infections. Discuss techniques for effective airway clearance, coughing, and vibrations.   AED/CPR: - Group verbal and written instruction with the use of models to demonstrate the basic use of the AED with  the basic ABC's of resuscitation.   Anatomy and Physiology of the Lungs: - Group verbal and written instruction with the use of models to provide basic lung anatomy and physiology related to function, structure and complications of lung disease.   Anatomy & Physiology of the Heart: - Group verbal and written instruction and models provide basic cardiac anatomy and physiology, with the coronary electrical and arterial systems. Review of Valvular disease and Heart Failure   Cardiac Medications: - Group verbal and written instruction to review commonly prescribed medications for heart disease. Reviews the medication, class of the drug, and side effects.   Know Your Numbers and Risk Factors: -Group verbal and written instruction about important numbers in your health.  Discussion of what are risk factors and how they play a role in the disease process.  Review of Cholesterol, Blood Pressure, Diabetes, and BMI and the role they play in your overall health.   Sleep Hygiene: -Provides group verbal and written instruction about how sleep can affect your health.  Define sleep hygiene, discuss sleep cycles and impact of sleep habits. Review good sleep hygiene tips.    Other: -Provides group and verbal instruction on various topics (see comments)    Knowledge Questionnaire Score: Knowledge Questionnaire Score - 03/22/19 0906      Knowledge Questionnaire Score   Pre Score  16/18 Education Focus: Oxygen Safety        Core Components/Risk Factors/Patient Goals at Admission: Personal Goals and Risk Factors at Admission - 03/22/19 0906      Core Components/Risk Factors/Patient Goals on Admission    Weight Management  Yes;Obesity;Weight Loss    Intervention  Weight Management: Develop a combined nutrition and exercise program designed to reach desired caloric intake, while maintaining appropriate intake of nutrient and fiber, sodium and fats, and appropriate energy expenditure required for  the weight goal.;Weight Management: Provide education and appropriate resources to help participant work on and attain  dietary goals.;Weight Management/Obesity: Establish reasonable short term and long term weight goals.;Obesity: Provide education and appropriate resources to help participant work on and attain dietary goals.    Admit Weight  230 lb (104.3 kg)    Goal Weight: Short Term  225 lb (102.1 kg)    Goal Weight: Long Term  220 lb (99.8 kg)    Expected Outcomes  Short Term: Continue to assess and modify interventions until short term weight is achieved;Long Term: Adherence to nutrition and physical activity/exercise program aimed toward attainment of established weight goal;Weight Loss: Understanding of general recommendations for a balanced deficit meal plan, which promotes 1-2 lb weight loss per week and includes a negative energy balance of [phone] kcal/d;Understanding recommendations for meals to include 15-35% energy as protein, 25-35% energy from fat, 35-60% energy from carbohydrates, less than 275m of dietary cholesterol, 20-35 gm of total fiber daily;Understanding of distribution of calorie intake throughout the day with the consumption of 4-5 meals/snacks    Tobacco Cessation  Yes    Intervention  Offer self-teaching materials, assist with locating and accessing local/national Quit Smoking programs, and support quit date choice.    Expected Outcomes  Short Term: Will quit all tobacco product use, adhering to prevention of relapse plan.;Long Term: Complete abstinence from all tobacco products for at least 12 months from quit date.    Improve shortness of breath with ADL's  Yes    Intervention  Provide education, individualized exercise plan and daily activity instruction to help decrease symptoms of SOB with activities of daily living.    Expected Outcomes  Short Term: Improve cardiorespiratory fitness to achieve a reduction of symptoms when performing ADLs;Long Term: Be able to perform  more ADLs without symptoms or delay the onset of symptoms    Heart Failure  Yes    Intervention  Provide a combined exercise and nutrition program that is supplemented with education, support and counseling about heart failure. Directed toward relieving symptoms such as shortness of breath, decreased exercise tolerance, and extremity edema.    Expected Outcomes  Improve functional capacity of life;Short term: Attendance in program 2-3 days a week with increased exercise capacity. Reported lower sodium intake. Reported increased fruit and vegetable intake. Reports medication compliance.;Short term: Daily weights obtained and reported for increase. Utilizing diuretic protocols set by physician.;Long term: Adoption of self-care skills and reduction of barriers for early signs and symptoms recognition and intervention leading to self-care maintenance.       Core Components/Risk Factors/Patient Goals Review:    Core Components/Risk Factors/Patient Goals at Discharge (Final Review):    ITP Comments: ITP Comments    Row Name 03/21/19 1140 03/22/19 0857 03/22/19 0858       ITP Comments  Completed virtual orientation today.  EP eval scheduled for 1/14 at 8am.  [facility]  Completed 6MWT and gym orientation.  Initial ITP created and sent for review to Dr. MEmily Filbert Medical Director.  JTearrahas recently quit tobacco use within the last 6 months. Intervention for relapse prevention was provided at the initial medical review. She was encouraged to continue to with tobacco cessation and was provided information on relapse prevention. Patient received information about combination therapy, tobacco cessation classes, quit line, and quit smoking apps in case of a relapse. Patient demonstrated understanding of this material.Staff will continue to provide encouragement and follow up with the patient throughout the program.        Comments:Initial  ITP

## 2019-03-22 NOTE — Patient Instructions (Signed)
Patient Instructions  Patient Details  Name: Terri Hughes MRN: [id number] Date of Birth: 02/17/1961 Referring Provider:  Thad Ranger, MD  Below are your personal goals for exercise, nutrition, and risk factors. Our goal is to help you stay on track towards obtaining and maintaining these goals. We will be discussing your progress on these goals with you throughout the program.  Initial Exercise Prescription: Initial Exercise Prescription - 03/22/19 0900      Date of Initial Exercise RX and Referring Provider   Date  03/22/19    Referring Provider  Thad Ranger MD      Treadmill   MPH  1.7    Grade  0    Minutes  15    METs  2.3      NuStep   Level  1    SPM  80    Minutes  15    METs  2      Biostep-RELP   Level  1    SPM  50    Minutes  15    METs  2      Prescription Details   Frequency (times per week)  1    Duration  Progress to 30 minutes of continuous aerobic without signs/symptoms of physical distress      Intensity   THRR 40-80% of Max Heartrate  110-145    Ratings of Perceived Exertion  11-13    Perceived Dyspnea  0-4      Progression   Progression  Continue to progress workloads to maintain intensity without signs/symptoms of physical distress.      Resistance Training   Training Prescription  Yes    Weight  3 lbs    Reps  10-15       Exercise Goals: Frequency: Be able to perform aerobic exercise two to three times per week in program working toward 2-5 days per week of home exercise.  Intensity: Work with a perceived exertion of 11 (fairly light) - 15 (hard) while following your exercise prescription.  We will make changes to your prescription with you as you progress through the program.   Duration: Be able to do 30 to 45 minutes of continuous aerobic exercise in addition to a 5 minute warm-up and a 5 minute cool-down routine.   Nutrition Goals: Your personal nutrition goals will be established when you do your nutrition analysis with  the dietician.  The following are general nutrition guidelines to follow: Cholesterol < 200mg /day Sodium < 1500mg /day Fiber: Women over 50 yrs - 21 grams per day  Personal Goals: Personal Goals and Risk Factors at Admission - 03/22/19 0906      Core Components/Risk Factors/Patient Goals on Admission    Weight Management  Yes;Obesity;Weight Loss    Intervention  Weight Management: Develop a combined nutrition and exercise program designed to reach desired caloric intake, while maintaining appropriate intake of nutrient and fiber, sodium and fats, and appropriate energy expenditure required for the weight goal.;Weight Management: Provide education and appropriate resources to help participant work on and attain dietary goals.;Weight Management/Obesity: Establish reasonable short term and long term weight goals.;Obesity: Provide education and appropriate resources to help participant work on and attain dietary goals.    Admit Weight  230 lb (104.3 kg)    Goal Weight: Short Term  225 lb (102.1 kg)    Goal Weight: Long Term  220 lb (99.8 kg)    Expected Outcomes  Short Term: Continue to assess and modify interventions until short  term weight is achieved;Long Term: Adherence to nutrition and physical activity/exercise program aimed toward attainment of established weight goal;Weight Loss: Understanding of general recommendations for a balanced deficit meal plan, which promotes 1-2 lb weight loss per week and includes a negative energy balance of [phone] kcal/d;Understanding recommendations for meals to include 15-35% energy as protein, 25-35% energy from fat, 35-60% energy from carbohydrates, less than 200mg  of dietary cholesterol, 20-35 gm of total fiber daily;Understanding of distribution of calorie intake throughout the day with the consumption of 4-5 meals/snacks    Tobacco Cessation  Yes    Intervention  Offer self-teaching materials, assist with locating and accessing local/national Quit Smoking  programs, and support quit date choice.    Expected Outcomes  Short Term: Will quit all tobacco product use, adhering to prevention of relapse plan.;Long Term: Complete abstinence from all tobacco products for at least 12 months from quit date.    Improve shortness of breath with ADL's  Yes    Intervention  Provide education, individualized exercise plan and daily activity instruction to help decrease symptoms of SOB with activities of daily living.    Expected Outcomes  Short Term: Improve cardiorespiratory fitness to achieve a reduction of symptoms when performing ADLs;Long Term: Be able to perform more ADLs without symptoms or delay the onset of symptoms    Heart Failure  Yes    Intervention  Provide a combined exercise and nutrition program that is supplemented with education, support and counseling about heart failure. Directed toward relieving symptoms such as shortness of breath, decreased exercise tolerance, and extremity edema.    Expected Outcomes  Improve functional capacity of life;Short term: Attendance in program 2-3 days a week with increased exercise capacity. Reported lower sodium intake. Reported increased fruit and vegetable intake. Reports medication compliance.;Short term: Daily weights obtained and reported for increase. Utilizing diuretic protocols set by physician.;Long term: Adoption of self-care skills and reduction of barriers for early signs and symptoms recognition and intervention leading to self-care maintenance.       Tobacco Use Initial Evaluation: Social History   Tobacco Use  Smoking Status Current Every Day Smoker  Smokeless Tobacco Never Used    Exercise Goals and Review: Exercise Goals    Row Name 03/22/19 0904             Exercise Goals   Increase Physical Activity  Yes       Intervention  Provide advice, education, support and counseling about physical activity/exercise needs.;Develop an individualized exercise prescription for aerobic and  resistive training based on initial evaluation findings, risk stratification, comorbidities and participant's personal goals.       Expected Outcomes  Short Term: Attend rehab on a regular basis to increase amount of physical activity.;Long Term: Add in home exercise to make exercise part of routine and to increase amount of physical activity.;Long Term: Exercising regularly at least 3-5 days a week.       Increase Strength and Stamina  Yes       Intervention  Provide advice, education, support and counseling about physical activity/exercise needs.       Expected Outcomes  Short Term: Increase workloads from initial exercise prescription for resistance, speed, and METs.;Short Term: Perform resistance training exercises routinely during rehab and add in resistance training at home;Long Term: Improve cardiorespiratory fitness, muscular endurance and strength as measured by increased METs and functional capacity (6MWT)       Able to understand and use rate of perceived exertion (RPE) scale  Yes  Intervention  Provide education and explanation on how to use RPE scale       Expected Outcomes  Short Term: Able to use RPE daily in rehab to express subjective intensity level;Long Term:  Able to use RPE to guide intensity level when exercising independently       Able to understand and use Dyspnea scale  Yes       Intervention  Provide education and explanation on how to use Dyspnea scale       Expected Outcomes  Short Term: Able to use Dyspnea scale daily in rehab to express subjective sense of shortness of breath during exertion;Long Term: Able to use Dyspnea scale to guide intensity level when exercising independently       Knowledge and understanding of Target Heart Rate Range (THRR)  Yes       Intervention  Provide education and explanation of THRR including how the numbers were predicted and where they are located for reference       Expected Outcomes  Short Term: Able to state/look up THRR;Short  Term: Able to use daily as guideline for intensity in rehab;Long Term: Able to use THRR to govern intensity when exercising independently       Able to check pulse independently  Yes       Intervention  Provide education and demonstration on how to check pulse in carotid and radial arteries.;Review the importance of being able to check your own pulse for safety during independent exercise       Expected Outcomes  Short Term: Able to explain why pulse checking is important during independent exercise;Long Term: Able to check pulse independently and accurately       Understanding of Exercise Prescription  Yes       Intervention  Provide education, explanation, and written materials on patient's individual exercise prescription       Expected Outcomes  Short Term: Able to explain program exercise prescription;Long Term: Able to explain home exercise prescription to exercise independently          Copy of goals given to participant.

## 2019-03-29 ENCOUNTER — Other Ambulatory Visit: Payer: Self-pay

## 2019-03-29 ENCOUNTER — Encounter: Payer: Medicaid Other | Admitting: *Deleted

## 2019-03-29 DIAGNOSIS — J449 Chronic obstructive pulmonary disease, unspecified: Secondary | ICD-10-CM

## 2019-03-29 NOTE — Progress Notes (Signed)
Daily Session Note  Patient Details  Name: Terri Hughes MRN: [id number] Date of Birth: 04-05-1960 Referring Provider:     Pulmonary Rehab from 03/22/2019 in [facility]  Referring Provider  Thad Ranger MD      Encounter Date: 03/29/2019  Check In: Session Check In - 03/29/19 0815      Check-In   Supervising physician immediately available to respond to emergencies  See telemetry face sheet for immediately available ER MD    Location  [facility]-Cardiac & Pulmonary Rehab    Staff Present  Heath Lark, RN, BSN, CCRP;Joseph Hood RCP,RRT,BSRT;Jessica Archer, [place], [employer], [place], CCET    Virtual Visit  No    Medication changes reported      No    Fall or balance concerns reported     No    Warm-up and Cool-down  Performed on first and last piece of equipment    Resistance Training Performed  Yes    VAD Patient?  No    PAD/SET Patient?  No      Pain Assessment   Currently in Pain?  No/denies          Social History   Tobacco Use  Smoking Status Current Every Day Smoker  Smokeless Tobacco Never Used    Goals Met:  Proper associated with RPD/PD & O2 Sat Exercise tolerated well Personal goals reviewed No report of cardiac concerns or symptoms  Goals Unmet:  Not Applicable  Comments:  First full day of exercise!  Patient was oriented to gym and equipment including functions, settings, policies, and procedures.  Patient's individual exercise prescription and treatment plan were reviewed.  All starting workloads were established based on the results of the 6 minute walk test done at initial orientation visit.  The plan for exercise progression was also introduced and progression will be customized based on patient's performance and goals..   Dr. Emily Filbert is Medical Director for [facility].

## 2019-04-02 ENCOUNTER — Other Ambulatory Visit: Payer: Self-pay

## 2019-04-02 ENCOUNTER — Encounter: Payer: Medicaid Other | Admitting: *Deleted

## 2019-04-02 ENCOUNTER — Encounter: Payer: Self-pay | Admitting: Obstetrics & Gynecology

## 2019-04-02 ENCOUNTER — Ambulatory Visit (INDEPENDENT_AMBULATORY_CARE_PROVIDER_SITE_OTHER): Payer: Medicaid Other | Admitting: Obstetrics & Gynecology

## 2019-04-02 VITALS — BP 120/78 | Temp 95.9°F | Ht 63.0 in | Wt 242.0 lb

## 2019-04-02 DIAGNOSIS — R232 Flushing: Secondary | ICD-10-CM

## 2019-04-02 DIAGNOSIS — J449 Chronic obstructive pulmonary disease, unspecified: Secondary | ICD-10-CM

## 2019-04-02 DIAGNOSIS — R350 Frequency of micturition: Secondary | ICD-10-CM

## 2019-04-02 DIAGNOSIS — N819 Female genital prolapse, unspecified: Secondary | ICD-10-CM

## 2019-04-02 DIAGNOSIS — N3946 Mixed incontinence: Secondary | ICD-10-CM

## 2019-04-02 NOTE — Progress Notes (Signed)
Daily Session Note  Patient Details  Name: ALEXEA BLASE MRN: [id number] Date of Birth: 07-22-1960 Referring Provider:     Pulmonary Rehab from 03/22/2019 in [facility] and [facility] Re[facility]  Referring Provider  Thad Ranger MD      Encounter Date: 04/02/2019  Check In: Session Check In - 04/02/19 0825      Check-In   Supervising physician immediately available to respond to emergencies  See telemetry face sheet for immediately available ER MD    Location  [facility] & Pulmonary Rehab    Staff Present  Heath Lark, RN, BSN, Laveda Norman, BS, ACSM CEP, Exercise Physiologist;Joseph Tessie Fass RCP,RRT,BSRT    Virtual Visit  No    Warm-up and Cool-down  Performed on first and last piece of equipment    Resistance Training Performed  Yes    VAD Patient?  No    PAD/SET Patient?  No      Pain Assessment   Currently in Pain?  No/denies          Social History   Tobacco Use  Smoking Status Current Every Day Smoker  Smokeless Tobacco Never Used    Goals Met:  Proper associated with RPD/PD & O2 Sat Independence with exercise equipment Exercise tolerated well No report of cardiac concerns or symptoms  Goals Unmet:  Not Applicable  Comments: Pt able to follow exercise prescription today without complaint.  Will continue to monitor for progression.    Dr. Emily Filbert is Medical Director for [facility] [place] [facility].

## 2019-04-02 NOTE — Patient Instructions (Signed)
Pelvic Organ Prolapse Pelvic organ prolapse is the stretching, bulging, or dropping of pelvic organs into an abnormal position. It happens when the muscles and tissues that surround and support pelvic structures become weak or stretched. Pelvic organ prolapse can involve the:  Vagina (vaginal prolapse).  Uterus (uterine prolapse).  Bladder (cystocele).  Rectum (rectocele).  Intestines (enterocele). When organs other than the vagina are involved, they often bulge into the vagina or protrude from the vagina, depending on how severe the prolapse is. What are the causes? This condition may be caused by:  Pregnancy, labor, and childbirth.  Past pelvic surgery.  Decreased production of the hormone estrogen associated with menopause.  Consistently lifting more than 50 lb (23 kg).  Obesity.  Long-term inability to pass stool (chronic constipation).  A cough that lasts a long time (chronic).  Buildup of fluid in the abdomen due to certain diseases and other conditions. What are the signs or symptoms? Symptoms of this condition include:  Passing a little urine (loss of bladder control) when you cough, sneeze, strain, and exercise (stress incontinence). This may be worse immediately after childbirth. It may gradually improve over time.  Feeling pressure in your pelvis or vagina. This pressure may increase when you cough or when you are passing stool.  A bulge that protrudes from the opening of your vagina.  Difficulty passing urine or stool.  Pain in your lower back.  Pain, discomfort, or disinterest in sex.  Repeated bladder infections (urinary tract infections).  Difficulty inserting a tampon. In some people, this condition causes no symptoms. How is this diagnosed? This condition may be diagnosed based on a vaginal and rectal exam. During the exam, you may be asked to cough and strain while you are lying down, sitting, and standing up. Your health care provider will  determine if other tests are required, such as bladder function tests. How is this treated? Treatment for this condition may depend on your symptoms. Treatment may include:  Lifestyle changes, such as changes to your diet.  Emptying your bladder at scheduled times (bladder training therapy). This can help reduce or avoid urinary incontinence.  Estrogen. Estrogen may help mild prolapse by increasing the strength and tone of pelvic floor muscles.  Kegel exercises. These may help mild cases of prolapse by strengthening and tightening the muscles of the pelvic floor.  A soft, flexible device that helps support the vaginal walls and keep pelvic organs in place (pessary). This is inserted into your vagina by your health care provider.  Surgery. This is often the only form of treatment for severe prolapse. Follow these instructions at home:  Avoid drinking beverages that contain caffeine or alcohol.  Increase your intake of high-fiber foods. This can help decrease constipation and straining during bowel movements.  Lose weight if recommended by your health care provider.  Wear a sanitary pad or adult diapers if you have urinary incontinence.  Avoid heavy lifting and straining with exercise and work. Do not hold your breath when you perform mild to moderate lifting and exercise activities. Limit your activities as directed by your health care provider.  Do Kegel exercises as directed by your health care provider. To do this: ? Squeeze your pelvic floor muscles tight. You should feel a tight lift in your rectal area and a tightness in your vaginal area. Keep your stomach, buttocks, and legs relaxed. ? Hold the muscles tight for up to 10 seconds. ? Relax your muscles. ? Repeat this exercise 50 times a day,   or as many times as told by your health care provider. Continue to do this exercise for at least 4-6 weeks, or for as long as told by your health care provider.  Take over-the-counter and  prescription medicines only as told by your health care provider.  If you have a pessary, take care of it as told by your health care provider.  Keep all follow-up visits as told by your health care provider. This is important. Contact a health care provider if you:  Have symptoms that interfere with your daily activities or sex life.  Need medicine to help with the discomfort.  Notice bleeding from your vagina that is not related to your period.  Have a fever.  Have pain or bleeding when you urinate.  Have bleeding when you pass stool.  Pass urine when you have sex.  Have chronic constipation.  Have a pessary that falls out.  Have bad smelling vaginal discharge.  Have an unusual, low pain in your abdomen. Summary  Pelvic organ prolapse is the stretching, bulging, or dropping of pelvic organs into an abnormal position. It happens when the muscles and tissues that surround and support pelvic structures become weak or stretched.  When organs other than the vagina are involved, they often bulge into the vagina or protrude from the vagina, depending on how severe the prolapse is.  In most cases, this condition needs to be treated only if it produces symptoms. Treatment may include lifestyle changes, estrogen, Kegel exercises, pessary insertion, or surgery.  Avoid heavy lifting and straining with exercise and work. Do not hold your breath when you perform mild to moderate lifting and exercise activities. Limit your activities as directed by your health care provider. This information is not intended to replace advice given to you by your health care provider. Make sure you discuss any questions you have with your health care provider. Document Revised: 03/16/2017 Document Reviewed: 03/16/2017 Elsevier Patient Education  [place], Cimicifuga racemosa oral dosage forms What is this medicine? BLACK COHOSH (blak KOH hosh) or Cimicifuga racemosa is a  dietary supplement. It is promoted to relieve symptoms of menopause, such as hot flashes. The FDA has not approved this supplement for any medical use. This supplement may be used for other purposes; ask your health care provider or pharmacist if you have questions. This medicine may be used for other purposes; ask your health care provider or pharmacist if you have questions. What should I tell my health care provider before I take this medicine? They need to know if you have any of these conditions:  breast cancer  cervical, ovarian or uterine cancer  high blood pressure  infertility  liver disease  menstrual changes or irregular periods  unusual vaginal or uterine bleeding  an unusual or allergic reaction to black cohosh, soybeans, tartrazine dye (yellow dye number 5), other medicines, foods, dyes, or preservatives  pregnant or trying to get pregnant  breast-feeding How should I use this medicine? Take this herb by mouth with a glass of water. Follow the directions on the package labeling, or talk to your health care professional. Do not use for longer than 6 months without the advice of a health care professional. Do not use if you are pregnant or breast-feeding. Talk to your obstetrician-gynecologist or certified nurse-midwife. This herb is not for use in children under the age of 47 years. Overdosage: If you think you have taken too much of this medicine contact a poison  control center or emergency room at once. NOTE: This medicine is only for you. Do not share this medicine with others. What if I miss a dose? If you miss a dose, take it as soon as you can. If it is almost time for your next dose, take only that dose. Do not take double or extra doses. What may interact with this medicine?  atorvastatin  cisplatin  fertility treatments This list may not describe all possible interactions. Give your health care provider a list of all the medicines, herbs, non-prescription  drugs, or dietary supplements you use. Also tell them if you smoke, drink alcohol, or use illegal drugs. Some items may interact with your medicine. What should I watch for while using this medicine? Since this herb is derived from a plant, allergic reactions are possible. Stop using this herb if you develop a rash. You may need to see your health care professional, or inform them that this occurred. Report any unusual side effects promptly. If you are taking this herb for menstrual or menopausal symptoms, visit your doctor or health care professional for regular checks on your progress. You should have a complete check-up every 6 months. You will need a regular breast and pelvic exam while on this therapy. Follow the advice of your doctor or health care professional. Women should inform their doctor if they wish to become pregnant or think they might be pregnant. If you have any reason to think you are pregnant, stop taking this herb at once and contact your doctor or health care professional. Herbal or dietary supplements are not regulated like medicines. Rigid quality control standards are not required for dietary supplements. The purity and strength of these products can vary. The safety and effect of this dietary supplement for a certain disease or illness is not well known. This product is not intended to diagnose, treat, cure or prevent any disease. The Food and Drug Administration suggests the following to help consumers protect themselves:  Always read product labels and follow directions.  Natural does not mean a product is safe for humans to take.  Look for products that include USP after the ingredient name. This means that the manufacturer followed the standards of the [place] Pharmacopoeia.  Supplements made or sold by a nationally known food or drug company are more likely to be made under tight controls. You can write to the company for more information about how the product was  made. What side effects may I notice from receiving this medicine? Side effects that you should report to your doctor or health care professional as soon as possible:  allergic reactions like skin rash, itching or hives, swelling of the face, lips, or tongue  breathing problems  dizziness  palpitations  signs and symptoms of liver injury like dark yellow or brown urine; general ill feeling or flu-like symptoms; light-colored stools; loss of appetite; nausea; right upper belly pain; unusually weak or tired; yellowing of the eyes or skin  unusual vaginal bleeding Side effects that usually do not require medical attention (report to your doctor or health care professional if they continue or are bothersome):  breast tenderness  headache  nausea  upset stomach This list may not describe all possible side effects. Call your doctor for medical advice about side effects. You may report side effects to FDA at 1-800-FDA-1088. Where should I keep my medicine? Keep out of the reach of children. Store at room temperature between 15 and 30 degrees C (59 and 86  degrees C). Throw away any unused herb after the expiration date. NOTE: This sheet is a summary. It may not cover all possible information. If you have questions about this medicine, talk to your doctor, pharmacist, or health care provider.  2020 Elsevier/Gold Standard (2015-09-03 14:35:09)

## 2019-04-02 NOTE — Progress Notes (Signed)
Cystocele/Rectocele Patient is a 59 yo WF w COPD who presents today complaining of a cystocele and vaginal prolapse in that she developed sx's 2 weeks ago of a falling sensationin her vagina.. Problem started 2 weeks ago. Symptoms include: prolapse of tissue with straining, urinary incontinence: mild and urinary frequency, urgency, nocturia, and incomplete emptying. Symptoms have gradually worsened.  Also reports regular hot flashes and night sweats, since her hysterectomy many years ago and never went away.  Not sexually active.   PMHx: She  has a past medical history of Asthma and CHF (congestive heart failure) ([place]). Also,  has a past surgical history that includes Appendectomy; Cesarean section; and Abdominal hysterectomy., family history includes Colon cancer in her maternal grandmother; Kidney cancer in her maternal aunt; Lung cancer in her maternal aunt; Uterine cancer in her maternal aunt.,  reports that she has been smoking. She has never used smokeless tobacco. She reports that she does not drink alcohol or use drugs.  She has a current medication list which includes the following prescription(s): albuterol, albuterol, budesonide-formoterol, montelukast, omeprazole, tiotropium, nitrofurantoin (macrocrystal-monohydrate), and nitrofurantoin (macrocrystal-monohydrate). Also, is allergic to hydrocodone; codeine; amitriptyline hcl; gabapentin; and oxycodone.  Review of Systems  Constitutional: Positive for malaise/fatigue. Negative for chills and fever.  HENT: Negative for congestion, sinus pain and sore throat.   Eyes: Negative for blurred vision and pain.  Respiratory: Positive for cough, shortness of breath and wheezing.   Cardiovascular: Negative for chest pain and leg swelling.  Gastrointestinal: Positive for constipation and diarrhea. Negative for abdominal pain, heartburn, nausea and vomiting.  Genitourinary: Positive for frequency and urgency. Negative for dysuria and hematuria.    Musculoskeletal: Positive for joint pain. Negative for back pain, myalgias and neck pain.  Skin: Positive for itching. Negative for rash.  Neurological: Negative for dizziness, tremors and weakness.  Endo/Heme/Allergies: Does not bruise/bleed easily.  Psychiatric/Behavioral: Positive for depression. The patient is nervous/anxious. The patient does not have insomnia.     Objective: BP 120/78   Temp (!) 95.9 F (35.5 C)   Ht 5\' 3"  (1.6 m)   Wt 242 lb (109.8 kg)   BMI 42.87 kg/m  Physical Exam Constitutional:      General: She is not in acute distress.    Appearance: She is well-developed. She is obese.  Genitourinary:     Pelvic exam was performed with patient supine.     Vagina normal.     No vaginal erythema or bleeding.     Genitourinary Comments: Cuff intact/ no lesions Absent uterus and cervix Tender exam  Gr 1 cystocele Mild vag vault prolapse, weakening  HENT:     Head: Normocephalic and atraumatic.     Nose: Nose normal.  Abdominal:     General: There is no distension.     Palpations: Abdomen is soft.     Tenderness: There is no abdominal tenderness.  Musculoskeletal:        General: Normal range of motion.  Neurological:     Mental Status: She is alert and oriented to person, place, and time.     Cranial Nerves: No cranial nerve deficit.  Skin:    General: Skin is warm and dry.  Psychiatric:        Attention and Perception: Attention normal.        Mood and Affect: Mood normal.        Speech: Speech normal.        Behavior: Behavior normal.        Cognition  and Memory: Cognition normal.        Judgment: Judgment normal.    ASSESSMENT/PLAN:   Problem List Items Addressed This Visit    Vaginal vault prolapse - Primary/New Problem   Urinary frequency       Mixed stress and urge urinary incontinence       Vasomotor flushing        Pessary Fitting Patient presents for a pessary fitting. She desires a pessary as her means of controlling her symptoms of  prolapse and/or urinary incontinence. She understands the care needed for a pessary and desires to proceed. Alternative treatment options have been discussed at length and the patient voices an understanding of each option.   PROCEDURE: The patient was placed in dorsal lithotomy position. Examination confirmed prolapse. A 2 ring w support pessary was fitted without difficulty. The patient subsequently ambulated, voided and performed valsalva maneuvers without dislodging the pessary and without discomfort. Care instructions were provided. Patient was discharged to home in stable condition. Pessary ordered and considered for therapy.   Surgery not recommended, based on degree of pelvic floor weakening, prior vag sling procedure, and her health condition w COPD and likely recurrence due to chronic cough and strain.    Expectant management and no therapy also an option, this is a quality of life issue, no negative effects of no treatment other than gradual worsening over time.   As this is recent onset to sx's, reasonable to wait and see.  Discussed treatment options for hot flashes and night sweats, need for further investigation as she is well past menopause based on year of TAH BSO, and options for OTC remedies such as Black Cohosh.  Will take black cohosh for 2 mos and see if helps, info provided.  A total of 50 minutes were spent face-to-face with the patient as well as preparation, review, communication, and documentation during this encounter.   Barnett Applebaum, MD, [facility], [facility] 04/02/2019  10:18 AM

## 2019-04-03 ENCOUNTER — Other Ambulatory Visit: Payer: Self-pay | Admitting: Obstetrics & Gynecology

## 2019-04-03 DIAGNOSIS — N819 Female genital prolapse, unspecified: Secondary | ICD-10-CM

## 2019-04-03 NOTE — Telephone Encounter (Signed)
Referral placed for [facility]

## 2019-04-03 NOTE — Telephone Encounter (Signed)
Please advise 

## 2019-04-04 ENCOUNTER — Encounter: Payer: Self-pay | Admitting: *Deleted

## 2019-04-04 DIAGNOSIS — J449 Chronic obstructive pulmonary disease, unspecified: Secondary | ICD-10-CM

## 2019-04-04 NOTE — Progress Notes (Signed)
Pulmonary Individual Treatment Plan  Patient Details  Name: Terri Hughes MRN: [id number] Date of Birth: 05/25/1960 Referring Provider:     Pulmonary Rehab from 03/22/2019 in [facility] Cardiac and Pulmonary Rehab  Referring Provider  Thad Ranger MD      Initial Encounter Date:    Pulmonary Rehab from 03/22/2019 in [facility] Cardiac and Pulmonary Rehab  Date  03/22/19      Visit Diagnosis: Chronic obstructive pulmonary disease, unspecified COPD type ([place])  Patient's Home Medications on Admission:  Current Outpatient Medications:  .  albuterol (ACCUNEB) 0.63 MG/3ML nebulizer solution, Take 1 ampule by nebulization every 6 (six) hours as needed for wheezing., Disp: , Rfl:  .  albuterol (VENTOLIN HFA) 108 (90 Base) MCG/ACT inhaler, Inhale 1-2 puffs into the lungs every 4 (four) hours as needed for wheezing or shortness of breath., Disp: , Rfl:  .  budesonide-formoterol (SYMBICORT) 160-4.5 MCG/ACT inhaler, Inhale 2 puffs into the lungs daily. , Disp: , Rfl:  .  montelukast (SINGULAIR) 10 MG tablet, Take 10 mg by mouth at bedtime. , Disp: , Rfl:  .  nitrofurantoin, macrocrystal-monohydrate, (MACROBID) 100 MG capsule, Take 1 capsule (100 mg total) by mouth 2 (two) times daily. (Patient not taking: Reported on 04/02/2019), Disp: 10 capsule, Rfl: 0 .  nitrofurantoin, macrocrystal-monohydrate, (MACROBID) 100 MG capsule, Take 1 capsule (100 mg total) by mouth 2 (two) times daily. (Patient not taking: Reported on 04/02/2019), Disp: 10 capsule, Rfl: 0 .  omeprazole (PRILOSEC) 20 MG capsule, Take 20 mg by mouth daily. , Disp: , Rfl:  .  tiotropium (SPIRIVA) 18 MCG inhalation capsule, Place into inhaler and inhale., Disp: , Rfl:   Past Medical History: Past Medical History:  Diagnosis Date  . Asthma   . CHF (congestive heart failure) (HCC)     Tobacco Use: Social History   Tobacco Use  Smoking Status Current Every Day Smoker  Smokeless Tobacco Never Used    Labs: Recent Review Flowsheet Data     There is no flowsheet data to display.       Pulmonary Assessment Scores: Pulmonary Assessment Scores    Row Name 03/22/19 0907         ADL UCSD   ADL Phase  Entry     SOB Score total  86     Rest  0     Walk  3     Stairs  5     Bath  4     Dress  4     Shop  5       CAT Score   CAT Score  28       mMRC Score   mMRC Score  4        UCSD: Self-administered rating of dyspnea associated with activities of daily living (ADLs) 6-point scale (0 = "not at all" to 5 = "maximal or unable to do because of breathlessness")  Scoring Scores range from 0 to 120.  Minimally important difference is 5 units  CAT: CAT can identify the health impairment of COPD patients and is better correlated with disease progression.  CAT has a scoring range of zero to 40. The CAT score is classified into four groups of low (less than 10), medium (10 - 20), high (21-30) and very high (31-40) based on the impact level of disease on health status. A CAT score over 10 suggests significant symptoms.  A worsening CAT score could be explained by an exacerbation, poor medication adherence, poor inhaler technique,  or progression of COPD or comorbid conditions.  CAT MCID is 2 points  mMRC: mMRC (Modified Medical Research Council) Dyspnea Scale is used to assess the degree of baseline functional disability in patients of respiratory disease due to dyspnea. No minimal important difference is established. A decrease in score of 1 point or greater is considered a positive change.   Pulmonary Function Assessment: Pulmonary Function Assessment - 03/22/19 0927      Initial Spirometry Results   FVC%  77 %   02/07/2019   FEV1%  53 %    FEV1/FVC Ratio  55      Post Bronchodilator Spirometry Results   FVC%  81 %    FEV1%  58 %    FEV1/FVC Ratio  57      Breath   Shortness of Breath  Yes;Fear of Shortness of Breath;Limiting activity;Panic with Shortness of Breath       Exercise Target Goals: Exercise  Program Goal: Individual exercise prescription set using results from initial 6 min walk test and THRR while considering  patient's activity barriers and safety.   Exercise Prescription Goal: Initial exercise prescription builds to 30-45 minutes a day of aerobic activity, 2-3 days per week.  Home exercise guidelines will be given to patient during program as part of exercise prescription that the participant will acknowledge.  Activity Barriers & Risk Stratification: Activity Barriers & Cardiac Risk Stratification - 03/21/19 1111      Activity Barriers & Cardiac Risk Stratification   Activity Barriers  Joint Problems;Other (comment);Deconditioning;Muscular Weakness;Shortness of Breath    Comments  occasional bilateral foot cramps on top of foot after being on feet for prolonged period of time (uses [employer] to treat pain)       6 Minute Walk: 6 Minute Walk    Row Name 03/22/19 0858         6 Minute Walk   Phase  Initial     Distance  1200 feet     Walk Time  6 minutes     # of Rest Breaks  0     MPH  2.27     METS  2.33     RPE  15     Perceived Dyspnea   4     VO2 Peak  8.14     Symptoms  Yes (comment)     Comments  dizzy (improved with rest), back pain 6/10, SOB     Resting HR  75 bpm     Resting BP  114/66     Resting Oxygen Saturation   96 %     Exercise Oxygen Saturation  during 6 min walk  87 %     Max Ex. HR  117 bpm     Max Ex. BP  144/70     2 Minute Post BP  136/72       Interval HR   1 Minute HR  100     2 Minute HR  110     3 Minute HR  116     4 Minute HR  117     5 Minute HR  115     6 Minute HR  109     2 Minute Post HR  79     Interval Heart Rate?  Yes       Interval Oxygen   Interval Oxygen?  Yes     Baseline Oxygen Saturation %  96 %     1 Minute Oxygen Saturation %  88 %     1 Minute Liters of Oxygen  0 L Room Air     2 Minute Oxygen Saturation %  87 %     2 Minute Liters of Oxygen  0 L     3 Minute Oxygen Saturation %  88 %     3 Minute  Liters of Oxygen  0 L     4 Minute Oxygen Saturation %  89 %     4 Minute Liters of Oxygen  0 L     5 Minute Oxygen Saturation %  87 %     5 Minute Liters of Oxygen  0 L     6 Minute Oxygen Saturation %  88 %     6 Minute Liters of Oxygen  0 L     2 Minute Post Oxygen Saturation %  94 %     2 Minute Post Liters of Oxygen  0 L       Oxygen Initial Assessment: Oxygen Initial Assessment - 03/21/19 1116      Home Oxygen   Home Oxygen Device  None    Sleep Oxygen Prescription  CPAP   just completed sleep study   Home Exercise Oxygen Prescription  None    Home at Rest Exercise Oxygen Prescription  None    Compliance with Home Oxygen Use  Yes      Initial 6 min Walk   Oxygen Used  None      Program Oxygen Prescription   Program Oxygen Prescription  None      Intervention   Short Term Goals  To learn and exhibit compliance with exercise, home and travel O2 prescription;To learn and understand importance of monitoring SPO2 with pulse oximeter and demonstrate accurate use of the pulse oximeter.;To learn and understand importance of maintaining oxygen saturations>88%;To learn and demonstrate proper pursed lip breathing techniques or other breathing techniques.;To learn and demonstrate proper use of respiratory medications    Long  Term Goals  Exhibits compliance with exercise, home and travel O2 prescription;Verbalizes importance of monitoring SPO2 with pulse oximeter and return demonstration;Maintenance of O2 saturations>88%;Exhibits proper breathing techniques, such as pursed lip breathing or other method taught during program session;Compliance with respiratory medication;Demonstrates proper use of MDI's       Oxygen Re-Evaluation: Oxygen Re-Evaluation    Row Name 03/29/19 0823             Program Oxygen Prescription   Program Oxygen Prescription  None         Home Oxygen   Home Oxygen Device  None       Sleep Oxygen Prescription  CPAP       Home Exercise Oxygen Prescription   None       Home at Rest Exercise Oxygen Prescription  None       Compliance with Home Oxygen Use  Yes         Goals/Expected Outcomes   Short Term Goals  To learn and demonstrate proper pursed lip breathing techniques or other breathing techniques.;To learn and understand importance of monitoring SPO2 with pulse oximeter and demonstrate accurate use of the pulse oximeter.;To learn and understand importance of maintaining oxygen saturations>88%       Long  Term Goals  Maintenance of O2 saturations>88%;Exhibits proper breathing techniques, such as pursed lip breathing or other method taught during program session;Verbalizes importance of monitoring SPO2 with pulse oximeter and return demonstration       Comments  Short: Use RPE  daily to regulate intensity. Long: Follow program prescription in THR.       Goals/Expected Outcomes  Short: Become more profiecient at using PLB.   Long: Become independent at using PLB.          Oxygen Discharge (Final Oxygen Re-Evaluation): Oxygen Re-Evaluation - 03/29/19 0823      Program Oxygen Prescription   Program Oxygen Prescription  None      Home Oxygen   Home Oxygen Device  None    Sleep Oxygen Prescription  CPAP    Home Exercise Oxygen Prescription  None    Home at Rest Exercise Oxygen Prescription  None    Compliance with Home Oxygen Use  Yes      Goals/Expected Outcomes   Short Term Goals  To learn and demonstrate proper pursed lip breathing techniques or other breathing techniques.;To learn and understand importance of monitoring SPO2 with pulse oximeter and demonstrate accurate use of the pulse oximeter.;To learn and understand importance of maintaining oxygen saturations>88%    Long  Term Goals  Maintenance of O2 saturations>88%;Exhibits proper breathing techniques, such as pursed lip breathing or other method taught during program session;Verbalizes importance of monitoring SPO2 with pulse oximeter and return demonstration    Comments  Short:  Use RPE daily to regulate intensity. Long: Follow program prescription in THR.    Goals/Expected Outcomes  Short: Become more profiecient at using PLB.   Long: Become independent at using PLB.       Initial Exercise Prescription: Initial Exercise Prescription - 03/22/19 0900      Date of Initial Exercise RX and Referring Provider   Date  03/22/19    Referring Provider  Thad Ranger MD      Treadmill   MPH  1.7    Grade  0    Minutes  15    METs  2.3      NuStep   Level  1    SPM  80    Minutes  15    METs  2      Biostep-RELP   Level  1    SPM  50    Minutes  15    METs  2      Prescription Details   Frequency (times per week)  1    Duration  Progress to 30 minutes of continuous aerobic without signs/symptoms of physical distress      Intensity   THRR 40-80% of Max Heartrate  110-145    Ratings of Perceived Exertion  11-13    Perceived Dyspnea  0-4      Progression   Progression  Continue to progress workloads to maintain intensity without signs/symptoms of physical distress.      Resistance Training   Training Prescription  Yes    Weight  3 lbs    Reps  10-15       Perform Capillary Blood Glucose checks as needed.  Exercise Prescription Changes:  Exercise Prescription Changes    Row Name 03/22/19 0900 04/02/19 1500           Response to Exercise   Blood Pressure (Admit)  114/66  146/64      Blood Pressure (Exercise)  144/70  144/72      Blood Pressure (Exit)  124/70  132/80      Heart Rate (Admit)  75 bpm  71 bpm      Heart Rate (Exercise)  117 bpm  125 bpm      Heart Rate (  Exit)  76 bpm  84 bpm      Oxygen Saturation (Admit)  96 %  -      Oxygen Saturation (Exercise)  87 %  -      Oxygen Saturation (Exit)  94 %  -      Rating of Perceived Exertion (Exercise)  15  15      Perceived Dyspnea (Exercise)  4  3      Symptoms  dizzy, back pain (6/10), SOB  -      Comments  walk test results  -      Duration  -  Continue with 30 min of aerobic  exercise without signs/symptoms of physical distress.      Intensity  -  THRR unchanged        Progression   Progression  -  Continue to progress workloads to maintain intensity without signs/symptoms of physical distress.      Average METs  -  2.7        Resistance Training   Training Prescription  -  Yes      Weight  -  3 lb      Reps  -  10-15        Bike   Level  -  3      Minutes  -  15      METs  -  2.7        Arm Ergometer   Level  -  1      Minutes  -  15         Exercise Comments:  Exercise Comments    Row Name 03/29/19 [phone]           Exercise Comments  First full day of exercise!  Patient was oriented to gym and equipment including functions, settings, policies, and procedures.  Patient's individual exercise prescription and treatment plan were reviewed.  All starting workloads were established based on the results of the 6 minute walk test done at initial orientation visit.  The plan for exercise progression was also introduced and progression will be customized based on patient's performance and goals..          Exercise Goals and Review:  Exercise Goals    Row Name 03/22/19 0904             Exercise Goals   Increase Physical Activity  Yes       Intervention  Provide advice, education, support and counseling about physical activity/exercise needs.;Develop an individualized exercise prescription for aerobic and resistive training based on initial evaluation findings, risk stratification, comorbidities and participant's personal goals.       Expected Outcomes  Short Term: Attend rehab on a regular basis to increase amount of physical activity.;Long Term: Add in home exercise to make exercise part of routine and to increase amount of physical activity.;Long Term: Exercising regularly at least 3-5 days a week.       Increase Strength and Stamina  Yes       Intervention  Provide advice, education, support and counseling about physical activity/exercise needs.        Expected Outcomes  Short Term: Increase workloads from initial exercise prescription for resistance, speed, and METs.;Short Term: Perform resistance training exercises routinely during rehab and add in resistance training at home;Long Term: Improve cardiorespiratory fitness, muscular endurance and strength as measured by increased METs and functional capacity (6MWT)       Able to understand and use rate  of perceived exertion (RPE) scale  Yes       Intervention  Provide education and explanation on how to use RPE scale       Expected Outcomes  Short Term: Able to use RPE daily in rehab to express subjective intensity level;Long Term:  Able to use RPE to guide intensity level when exercising independently       Able to understand and use Dyspnea scale  Yes       Intervention  Provide education and explanation on how to use Dyspnea scale       Expected Outcomes  Short Term: Able to use Dyspnea scale daily in rehab to express subjective sense of shortness of breath during exertion;Long Term: Able to use Dyspnea scale to guide intensity level when exercising independently       Knowledge and understanding of Target Heart Rate Range (THRR)  Yes       Intervention  Provide education and explanation of THRR including how the numbers were predicted and where they are located for reference       Expected Outcomes  Short Term: Able to state/look up THRR;Short Term: Able to use daily as guideline for intensity in rehab;Long Term: Able to use THRR to govern intensity when exercising independently       Able to check pulse independently  Yes       Intervention  Provide education and demonstration on how to check pulse in carotid and radial arteries.;Review the importance of being able to check your own pulse for safety during independent exercise       Expected Outcomes  Short Term: Able to explain why pulse checking is important during independent exercise;Long Term: Able to check pulse independently and  accurately       Understanding of Exercise Prescription  Yes       Intervention  Provide education, explanation, and written materials on patient's individual exercise prescription       Expected Outcomes  Short Term: Able to explain program exercise prescription;Long Term: Able to explain home exercise prescription to exercise independently          Exercise Goals Re-Evaluation : Exercise Goals Re-Evaluation    Row Name 03/29/19 [phone]             Exercise Goal Re-Evaluation   Exercise Goals Review  Able to understand and use Dyspnea scale;Able to understand and use rate of perceived exertion (RPE) scale;Knowledge and understanding of Target Heart Rate Range (THRR);Able to check pulse independently;Understanding of Exercise Prescription       Comments  Reviewed RPE scale, THR and program prescription with pt today.  Pt voiced understanding and was given a copy of goals to take home.       Expected Outcomes  Short: Use RPE daily to regulate intensity. Long: Follow program prescription in THR.          Discharge Exercise Prescription (Final Exercise Prescription Changes): Exercise Prescription Changes - 04/02/19 1500      Response to Exercise   Blood Pressure (Admit)  146/64    Blood Pressure (Exercise)  144/72    Blood Pressure (Exit)  132/80    Heart Rate (Admit)  71 bpm    Heart Rate (Exercise)  125 bpm    Heart Rate (Exit)  84 bpm    Rating of Perceived Exertion (Exercise)  15    Perceived Dyspnea (Exercise)  3    Duration  Continue with 30 min of aerobic exercise without signs/symptoms of  physical distress.    Intensity  THRR unchanged      Progression   Progression  Continue to progress workloads to maintain intensity without signs/symptoms of physical distress.    Average METs  2.7      Resistance Training   Training Prescription  Yes    Weight  3 lb    Reps  10-15      Bike   Level  3    Minutes  15    METs  2.7      Arm Ergometer   Level  1    Minutes  15        Nutrition:  Target Goals: Understanding of nutrition guidelines, daily intake of sodium <1560m, cholesterol <2033m calories 30% from fat and 7% or less from saturated fats, daily to have 5 or more servings of fruits and vegetables.  Biometrics: Pre Biometrics - 03/22/19 0905      Pre Biometrics   Height  5' 2.9" (1.598 m)    Weight  243 lb (110.2 kg)    BMI (Calculated)  43.16    Single Leg Stand  15.81 seconds        Nutrition Therapy Plan and Nutrition Goals:   Nutrition Assessments: Nutrition Assessments - 03/22/19 0905      MEDFICTS Scores   Pre Score  62       Nutrition Goals Re-Evaluation:   Nutrition Goals Discharge (Final Nutrition Goals Re-Evaluation):   Psychosocial: Target Goals: Acknowledge presence or absence of significant depression and/or stress, maximize coping skills, provide positive support system. Participant is able to verbalize types and ability to use techniques and skills needed for reducing stress and depression.   Initial Review & Psychosocial Screening: Initial Psych Review & Screening - 03/21/19 1118      Initial Review   Current issues with  Current Depression;Current Anxiety/Panic;Current Stress Concerns    Source of Stress Concerns  Unable to participate in former interests or hobbies;Unable to perform yard/household activities;Retirement/disability;Financial;Chronic Illness    Comments  Currently applying for disability (first round was denied), has tried Xanax before, but not currently on it, anti-depressive make suicidal ideations worse, bowel and bladder incontenince (vaginal fault) and has appt with OBJennings Yes   six children (three are local)   Comments  Three of her six children live in the area and has a granddaughter that lives with her to help out.      Barriers   Psychosocial barriers to participate in program  The patient should benefit from training in stress management  and relaxation.;There are no identifiable barriers or psychosocial needs.      Screening Interventions   Interventions  Encouraged to exercise;Provide feedback about the scores to participant;To provide support and resources with identified psychosocial needs    Expected Outcomes  Long Term Goal: Stressors or current issues are controlled or eliminated.;Short Term goal: Identification and review with participant of any Quality of Life or Depression concerns found by scoring the questionnaire.;Long Term goal: The participant improves quality of Life and PHQ9 Scores as seen by post scores and/or verbalization of changes       Quality of Life Scores:  Scores of 19 and below usually indicate a poorer quality of life in these areas.  A difference of  2-3 points is a clinically meaningful difference.  A difference of 2-3 points in the total score of the Quality of Life Index has  been associated with significant improvement in overall quality of life, self-image, physical symptoms, and general health in studies assessing change in quality of life.  PHQ-9: Recent Review Flowsheet Data    Depression screen [facility] 2/9 03/22/2019   Decreased Interest 1   Down, Depressed, Hopeless 2   PHQ - 2 Score 3   Altered sleeping 3   Tired, decreased energy 3   Change in appetite 3   Feeling bad or failure about yourself  2   Trouble concentrating 3   Moving slowly or fidgety/restless 0   Suicidal thoughts 0   PHQ-9 Score 17   Difficult doing work/chores Very difficult     Interpretation of Total Score  Total Score Depression Severity:  1-4 = Minimal depression, 5-9 = Mild depression, 10-14 = Moderate depression, 15-19 = Moderately severe depression, 20-27 = Severe depression   Psychosocial Evaluation and Intervention: Psychosocial Evaluation - 03/21/19 1129      Psychosocial Evaluation & Interventions   Interventions  Stress management education;Encouraged to exercise with the program and follow exercise  prescription    Comments  Terri Hughes is coming to pulmonary rehab.  She is coming in for COPD and had a recent hospitalization for an exacerbation and hypoxia.  She has a significant history of depression and anxiety.  She feels that her depression is currently under control but her anxiety is starting to spiral.  In the past she was on xanax for anxiety and it worked and helped her sleep, but she was taken off of it.  She does not do well on anti-depression meds as they seem to make her suicidal ideations worse.  She is currently thinking about seeing someone at the [facility] for help with her anxiety.  She is also hoping that the exercise will help as well.  She is looking forwad to the program.  She wants to learn to be comfortable with exercise and build her confidence back up.  She doesn't want to continue to worry about her heart and sats dropping when she exercises.  She will benefit from exercise from the strength and stamina side as well.  She also has issues with bowel and urinary incontenince that ofter leads to infections.  She is planning to see her OB-GYN about this and is considering pelvic floor therapy as well.    Expected Outcomes  Short: Attend rehab regularly to build confidence in her ability to exercise.  Long: Better management of anxiety.    Continue Psychosocial Services   Follow up required by staff       Psychosocial Re-Evaluation:   Psychosocial Discharge (Final Psychosocial Re-Evaluation):   Education: Education Goals: Education classes will be provided on a weekly basis, covering required topics. Participant will state understanding/return demonstration of topics presented.  Learning Barriers/Preferences: Learning Barriers/Preferences - 03/21/19 1113      Learning Barriers/Preferences   Learning Barriers  Reading;Sight   occasional "brain fog", glasses   Learning Preferences  Written Material       Education Topics:  Initial Evaluation Education: - Verbal,  written and demonstration of respiratory meds, oximetry and breathing techniques. Instruction on use of nebulizers and MDIs and importance of monitoring MDI activations.   Pulmonary Rehab from 03/22/2019 in [facility] Cardiac and Pulmonary Rehab  Date  03/22/19  Educator  [facility]  Instruction Review Code  1- Verbalizes Understanding      General Nutrition Guidelines/Fats and Fiber: -Group instruction provided by verbal, written material, models and posters to present the general guidelines  for heart healthy nutrition. Gives an explanation and review of dietary fats and fiber.   Controlling Sodium/Reading Food Labels: -Group verbal and written material supporting the discussion of sodium use in heart healthy nutrition. Review and explanation with models, verbal and written materials for utilization of the food label.   Exercise Physiology & General Exercise Guidelines: - Group verbal and written instruction with models to review the exercise physiology of the cardiovascular system and associated critical values. Provides general exercise guidelines with specific guidelines to those with heart or lung disease.    Aerobic Exercise & Resistance Training: - Gives group verbal and written instruction on the various components of exercise. Focuses on aerobic and resistive training programs and the benefits of this training and how to safely progress through these programs.   Flexibility, Balance, Mind/Body Relaxation: Provides group verbal/written instruction on the benefits of flexibility and balance training, including mind/body exercise modes such as yoga, pilates and tai chi.  Demonstration and skill practice provided.   Stress and Anxiety: - Provides group verbal and written instruction about the health risks of elevated stress and causes of high stress.  Discuss the correlation between heart/lung disease and anxiety and treatment options. Review healthy ways to manage with stress and  anxiety.   Depression: - Provides group verbal and written instruction on the correlation between heart/lung disease and depressed mood, treatment options, and the stigmas associated with seeking treatment.   Exercise & Equipment Safety: - Individual verbal instruction and demonstration of equipment use and safety with use of the equipment.   Pulmonary Rehab from 03/22/2019 in [facility] Cardiac and Pulmonary Rehab  Date  03/22/19  Educator  [facility]  Instruction Review Code  1- Verbalizes Understanding      Infection Prevention: - Provides verbal and written material to individual with discussion of infection control including proper hand washing and proper equipment cleaning during exercise session.   Pulmonary Rehab from 03/22/2019 in [facility] Cardiac and Pulmonary Rehab  Date  03/22/19  Educator  [facility]  Instruction Review Code  1- Verbalizes Understanding      Falls Prevention: - Provides verbal and written material to individual with discussion of falls prevention and safety.   Pulmonary Rehab from 03/22/2019 in [facility] Cardiac and Pulmonary Rehab  Date  03/22/19  Educator  [facility]  Instruction Review Code  1- Verbalizes Understanding      Diabetes: - Individual verbal and written instruction to review signs/symptoms of diabetes, desired ranges of glucose level fasting, after meals and with exercise. Advice that pre and post exercise glucose checks will be done for 3 sessions at entry of program.   Chronic Lung Diseases: - Group verbal and written instruction to review updates, respiratory medications, advancements in procedures and treatments. Discuss use of supplemental oxygen including available portable oxygen systems, continuous and intermittent flow rates, concentrators, personal use and safety guidelines. Review proper use of inhaler and spacers. Provide informative websites for self-education.    Energy Conservation: - Provide group verbal and written instruction for methods to conserve  energy, plan and organize activities. Instruct on pacing techniques, use of adaptive equipment and posture/positioning to relieve shortness of breath.   Triggers and Exacerbations: - Group verbal and written instruction to review types of environmental triggers and ways to prevent exacerbations. Discuss weather changes, air quality and the benefits of nasal washing. Review warning signs and symptoms to help prevent infections. Discuss techniques for effective airway clearance, coughing, and vibrations.   AED/CPR: - Group verbal and written instruction  with the use of models to demonstrate the basic use of the AED with the basic ABC's of resuscitation.   Anatomy and Physiology of the Lungs: - Group verbal and written instruction with the use of models to provide basic lung anatomy and physiology related to function, structure and complications of lung disease.   Anatomy & Physiology of the Heart: - Group verbal and written instruction and models provide basic cardiac anatomy and physiology, with the coronary electrical and arterial systems. Review of Valvular disease and Heart Failure   Cardiac Medications: - Group verbal and written instruction to review commonly prescribed medications for heart disease. Reviews the medication, class of the drug, and side effects.   Know Your Numbers and Risk Factors: -Group verbal and written instruction about important numbers in your health.  Discussion of what are risk factors and how they play a role in the disease process.  Review of Cholesterol, Blood Pressure, Diabetes, and BMI and the role they play in your overall health.   Sleep Hygiene: -Provides group verbal and written instruction about how sleep can affect your health.  Define sleep hygiene, discuss sleep cycles and impact of sleep habits. Review good sleep hygiene tips.    Other: -Provides group and verbal instruction on various topics (see comments)    Knowledge Questionnaire  Score: Knowledge Questionnaire Score - 03/22/19 0906      Knowledge Questionnaire Score   Pre Score  16/18 Education Focus: Oxygen Safety        Core Components/Risk Factors/Patient Goals at Admission: Personal Goals and Risk Factors at Admission - 03/22/19 0906      Core Components/Risk Factors/Patient Goals on Admission    Weight Management  Yes;Obesity;Weight Loss    Intervention  Weight Management: Develop a combined nutrition and exercise program designed to reach desired caloric intake, while maintaining appropriate intake of nutrient and fiber, sodium and fats, and appropriate energy expenditure required for the weight goal.;Weight Management: Provide education and appropriate resources to help participant work on and attain dietary goals.;Weight Management/Obesity: Establish reasonable short term and long term weight goals.;Obesity: Provide education and appropriate resources to help participant work on and attain dietary goals.    Admit Weight  230 lb (104.3 kg)    Goal Weight: Short Term  225 lb (102.1 kg)    Goal Weight: Long Term  220 lb (99.8 kg)    Expected Outcomes  Short Term: Continue to assess and modify interventions until short term weight is achieved;Long Term: Adherence to nutrition and physical activity/exercise program aimed toward attainment of established weight goal;Weight Loss: Understanding of general recommendations for a balanced deficit meal plan, which promotes 1-2 lb weight loss per week and includes a negative energy balance of [id number] kcal/d;Understanding recommendations for meals to include 15-35% energy as protein, 25-35% energy from fat, 35-60% energy from carbohydrates, less than 263m of dietary cholesterol, 20-35 gm of total fiber daily;Understanding of distribution of calorie intake throughout the day with the consumption of 4-5 meals/snacks    Tobacco Cessation  Yes    Intervention  Offer self-teaching materials, assist with locating and accessing  local/national Quit Smoking programs, and support quit date choice.    Expected Outcomes  Short Term: Will quit all tobacco product use, adhering to prevention of relapse plan.;Long Term: Complete abstinence from all tobacco products for at least 12 months from quit date.    Improve shortness of breath with ADL's  Yes    Intervention  Provide education, individualized exercise plan and daily  activity instruction to help decrease symptoms of SOB with activities of daily living.    Expected Outcomes  Short Term: Improve cardiorespiratory fitness to achieve a reduction of symptoms when performing ADLs;Long Term: Be able to perform more ADLs without symptoms or delay the onset of symptoms    Heart Failure  Yes    Intervention  Provide a combined exercise and nutrition program that is supplemented with education, support and counseling about heart failure. Directed toward relieving symptoms such as shortness of breath, decreased exercise tolerance, and extremity edema.    Expected Outcomes  Improve functional capacity of life;Short term: Attendance in program 2-3 days a week with increased exercise capacity. Reported lower sodium intake. Reported increased fruit and vegetable intake. Reports medication compliance.;Short term: Daily weights obtained and reported for increase. Utilizing diuretic protocols set by physician.;Long term: Adoption of self-care skills and reduction of barriers for early signs and symptoms recognition and intervention leading to self-care maintenance.       Core Components/Risk Factors/Patient Goals Review:    Core Components/Risk Factors/Patient Goals at Discharge (Final Review):    ITP Comments: ITP Comments    Row Name 03/21/19 1140 03/22/19 0857 03/22/19 0858 03/29/19 0818 04/04/19 1429   ITP Comments  Completed virtual orientation today.  EP eval scheduled for 1/14 at 8am.  Documentation for diagnosis can be in [facility] encounter 01/03/19 and 02/28/19.  Completed 6MWT and gym  orientation.  Initial ITP created and sent for review to Dr. Emily Filbert, Medical Director.  Terri Hughes has recently quit tobacco use within the last 6 months. Intervention for relapse prevention was provided at the initial medical review. She was encouraged to continue to with tobacco cessation and was provided information on relapse prevention. Patient received information about combination therapy, tobacco cessation classes, quit line, and quit smoking apps in case of a relapse. Patient demonstrated understanding of this material.Staff will continue to provide encouragement and follow up with the patient throughout the program.  First full day of exercise!  Patient was oriented to gym and equipment including functions, settings, policies, and procedures.  Patient's individual exercise prescription and treatment plan were reviewed.  All starting workloads were established based on the results of the 6 minute walk test done at initial orientation visit.  The plan for exercise progression was also introduced and progression will be customized based on patient's performance and goals..  30 day review completed. ITP sent to Dr. Emily Filbert, Medical Director of Cardiac and Pulmonary Rehab. Continue with ITP unless changes are made by physician.  Department operating under reduced schedule until further notice by request from hospital leadership.  New to program oriented 03/22/19.      Comments: 30 day review

## 2019-04-05 ENCOUNTER — Other Ambulatory Visit: Payer: Self-pay

## 2019-04-05 DIAGNOSIS — J449 Chronic obstructive pulmonary disease, unspecified: Secondary | ICD-10-CM

## 2019-04-05 NOTE — Progress Notes (Signed)
Completed Initial RD Eval 

## 2019-04-16 ENCOUNTER — Other Ambulatory Visit: Payer: Self-pay

## 2019-04-16 ENCOUNTER — Encounter: Payer: Medicaid Other | Attending: Pulmonary Disease | Admitting: *Deleted

## 2019-04-16 DIAGNOSIS — J449 Chronic obstructive pulmonary disease, unspecified: Secondary | ICD-10-CM

## 2019-04-16 NOTE — Progress Notes (Signed)
Daily Session Note  Patient Details  Name: Terri Hughes MRN: [id number] Date of Birth: 04/11/1960 Referring Provider:     Pulmonary Rehab from 03/22/2019 in [facility] Cardiac and Pulmonary Rehab  Referring Provider  Thad Ranger MD      Encounter Date: 04/16/2019  Check In: Session Check In - 04/16/19 0749      Check-In   Supervising physician immediately available to respond to emergencies  See telemetry face sheet for immediately available ER MD    Location  [facility]-Cardiac & Pulmonary Rehab    Staff Present  Heath Lark, RN, BSN, CCRP;Joseph Hood RCP,RRT,BSRT;Kelly [employer], [place], ACSM CEP, Exercise Physiologist    Virtual Visit  No    Medication changes reported      No    Fall or balance concerns reported     No    Warm-up and Cool-down  Performed on first and last piece of equipment    Resistance Training Performed  Yes    VAD Patient?  No    PAD/SET Patient?  No      Pain Assessment   Currently in Pain?  No/denies          Social History   Tobacco Use  Smoking Status Current Every Day Smoker  Smokeless Tobacco Never Used    Goals Met:  Proper associated with RPD/PD & O2 Sat Independence with exercise equipment Exercise tolerated well No report of cardiac concerns or symptoms  Goals Unmet:  Not Applicable  Comments: Pt able to follow exercise prescription today without complaint.  Will continue to monitor for progression.  PHQ 9 repeated  Score decreased 17 to 7 today.  Dr. Emily Filbert is Medical Director for [place] and [facility].

## 2019-04-17 ENCOUNTER — Encounter: Payer: Medicaid Other | Admitting: *Deleted

## 2019-04-17 ENCOUNTER — Other Ambulatory Visit: Payer: Self-pay

## 2019-04-17 DIAGNOSIS — J449 Chronic obstructive pulmonary disease, unspecified: Secondary | ICD-10-CM

## 2019-04-17 NOTE — Progress Notes (Signed)
Daily Session Note  Patient Details  Name: Terri Hughes MRN: [id number] Date of Birth: 1960-08-21 Referring Provider:     Pulmonary Rehab from 03/22/2019 in [facility] Cardiac and Pulmonary Rehab  Referring Provider  Thad Ranger MD      Encounter Date: 04/17/2019  Check In: Session Check In - 04/17/19 1117      Check-In   Supervising physician immediately available to respond to emergencies  See telemetry face sheet for immediately available ER MD    Location  [facility]-Cardiac & Pulmonary Rehab    Staff Present  Renita Papa, RN BSN;Melissa Caiola RDN, LDN;Jessica Luan Pulling, MA, RCEP, CCRP, CCET    Virtual Visit  No    Medication changes reported      No    Fall or balance concerns reported     No    Warm-up and Cool-down  Performed on first and last piece of equipment    Resistance Training Performed  Yes    VAD Patient?  No    PAD/SET Patient?  No      Pain Assessment   Currently in Pain?  No/denies          Social History   Tobacco Use  Smoking Status Current Every Day Smoker  Smokeless Tobacco Never Used    Goals Met:  Independence with exercise equipment Exercise tolerated well No report of cardiac concerns or symptoms Strength training completed today  Goals Unmet:  Not Applicable  Comments: Pt able to follow exercise prescription today without complaint.  Will continue to monitor for progression.    Dr. Emily Filbert is Medical Director for [place] and [facility].

## 2019-04-18 ENCOUNTER — Ambulatory Visit: Payer: Medicaid Other | Attending: Obstetrics & Gynecology

## 2019-04-18 ENCOUNTER — Other Ambulatory Visit: Payer: Self-pay

## 2019-04-18 DIAGNOSIS — M533 Sacrococcygeal disorders, not elsewhere classified: Secondary | ICD-10-CM | POA: Insufficient documentation

## 2019-04-18 DIAGNOSIS — R278 Other lack of coordination: Secondary | ICD-10-CM | POA: Diagnosis present

## 2019-04-18 DIAGNOSIS — M4125 Other idiopathic scoliosis, thoracolumbar region: Secondary | ICD-10-CM | POA: Diagnosis not present

## 2019-04-18 DIAGNOSIS — M62838 Other muscle spasm: Secondary | ICD-10-CM | POA: Insufficient documentation

## 2019-04-18 NOTE — Therapy (Signed)
Terri [facility] SERVICES [address] [place], [place], [address] Phone: [phone]   Fax:  [phone]  Physical Therapy Evaluation  The patient has been informed of current processes in place at Outpatient Rehab to protect patients from Covid-19 exposure including social distancing, schedule modifications, and new cleaning procedures. After discussing their particular risk with a therapist based on the patient's personal risk factors, the patient has decided to proceed with in-person therapy.   Patient Details  Name: Terri Hughes MRN: [id number] Date of Birth: 05-07-1960 No data recorded  Encounter Date: 04/18/2019  PT End of Session - 04/18/19 1402    Visit Number  1    Number of Visits  10    Date for PT Re-Evaluation  06/27/19    Authorization Type  Mcaid    Authorization - Visit Number  1    Authorization - Number of Visits  4    PT Start Time  0940    PT Stop Time  1055    PT Time Calculation (min)  75 min    Activity Tolerance  Patient tolerated treatment well;No increased pain    Behavior During Therapy  WFL for tasks assessed/performed       Past Medical History:  Diagnosis Date  . Asthma   . CHF (congestive heart failure) ([place])     Past Surgical History:  Procedure Laterality Date  . ABDOMINAL HYSTERECTOMY    . APPENDECTOMY    . CESAREAN SECTION      There were no vitals filed for this visit.  Pelvic Floor Physical Therapy Evaluation and Assessment  SCREENING  Falls in last 6 mo: no   Red Flags:  Have you had any night sweats? Yes, "all the time" since her hysterectomy Unexplained weight loss? no Saddle anesthesia? no Unexplained changes in bowel or bladder habits? no  SUBJECTIVE  Patient reports: Spiriva has made her extremenly constipated, taking fiber gummies, dulcolax stool softener and laxitive, higher fiber foods. Before taking Spiriva (~ 1 month ago) she was typically on the diarrhea side of  the spectrum with her IBS. When she had a hysterectomy, they cut her anal sphincter because of hemorrhoids.   She gets "brain fogs" and did not realize that she had not had a BM in 3 weeks but was able to finally get it moving at home.  Incontinence started gradually with just coughing ~ 2 years ago, gradually worsened to having large leakage amounts,  Using large pads because she does not like the briefs, she does not use pads at home, sits most of the day and has a towel down that catches leakage from coughing. Her bathroom at home is "exactly 22 steps away" using bathroom mapping in public.  Attempted pessary but "It hurt"  Pelvic pain increases with straining: BM's, bending, lifting, etc.  Is doing ~ 50 kegels a day for ~ 4 weeks which seems to have helped with feeling of "sitting on a corn-cob"  Has had LBP for a really long time, Standing still for ~ 15 min. Will increase her pain to an unbearable amount.  Has "a lot of foot problems" uses theraworks (magnezium) before therapy  Precautions:  COPD, Asthma, obesity, Hx. Of Hysterectomy and oophrectomy. Bladder sling. Appendectomy  Social/Family/Vocational History:   Going to Pulmonary rehab 2x/wk  Recent Procedures/Tests/Findings:  none  Obstetrical History: 7 Pregnancies, 1 miscarriage, twins with one, vaginal deliveries with all.except last one which was a c-section.Had episiotomy, her 2nd twin  was breech.  Gynecological History: Had a hysterectomy in 2010 due to heavy bleeding and then oophorectomy due to pelvic pain in ~ 2013.  Urinary History: Has leakage with coughing all the time "I dont really jump or bend" has strong urgency and "it just starts coming out"   Gastrointestinal History: Was having ~ bristol 5-6  until ~ 1 month ago, now having more like pellets unless she takes gummies, softeners, etc. Still has leakage with coughing even with firmer stool.   Sexual activity/pain: Hx. Of pain with  intercourse   Location of pain:lower abdomen, pelvic Current pain:  1/10  Max pain:  8/10 Least pain:  0/10 Nature of pain: spasms  Location of pain: LBP Current pain:  5/10  Max pain:  10+/10 Least pain:  2/10 Nature of pain: constant, sharp and dull  Patient Goals: Be able to get out of the house to get groceries or socialize without having an accident.    OBJECTIVE  Posture/Observations:  Sitting: prop-sitting Standing: R shoulder low, R handed, 5'3", anterior pelvic tilt, R knee bent, R PSIS high  Palpation/Segmental Motion/Joint Play:  Special tests:   Forward bend: Fingers ~ 10 inches from the floor R lumbar, L thoracic curve. Leg-length: RLE long by ~1 cm. Supine-to-long-sit: RLEW long in both.   Range of Motion/Flexibilty:  Spine: ~ 3 fingers from knee on R SB, 5 fingers with L SB, L rot: ~50% limited, pain in R upper back, R rot: ~ 70% limited, pain in L SIJ Hips:   Strength/MMT: Deferred to follow-up LE MMT  LE MMT Left Right  Hip flex:  (L2) /5 /5  Hip ext: /5 /5  Hip abd: /5 /5  Hip add: /5 /5  Hip IR /5 /5  Hip ER /5 /5     Abdominal:  Palpation: TTP to B ASIS, Pt. Reports sitting most of the day, pain with standing upright. Diastasis: not assessed but suspected  Pelvic Floor External Exam: Deferred to follow-up Introitus Appears:  Skin integrity:  Palpation: Cough: Prolapse visible?: Scar mobility:  Internal Vaginal Exam: Strength (PERF):  Symmetry: Palpation: Prolapse:   Internal Rectal Exam: Strength (PERF): Symmetry: Palpation: Prolapse:   Gait Analysis: Antalgic, slight forward lean.   Pelvic Floor Outcome Measures: FOTO Urinary: 42 Bowel: 41 Cnst: 47  INTERVENTIONS THIS SESSION: Self-care: Educated on the structure and function of the pelvic floor in relation to their symptoms as well as the POC, and initial HEP in order to set patient expectations and understanding from which we will build on in the future sessions.  Educated on Diaphragmatic breathing to facilitate improved coordination and spasm reduction techniques. Therex: educated on and practiced side-stretch to decrease tension in LB and hips for decreased pain.   Total time: 75 min.                  Objective measurements completed on examination: See above findings.                PT Short Term Goals - 04/18/19 1414      PT SHORT TERM GOAL #1   Title  Patient will demonstrate improved pelvic alignment and balance of musculature surrounding the pelvis to facilitate decreased PFM spasms and decrease pelvic pain.    Baseline  RLE long, spasms surrounding pelvis and LB.    Time  5    Period  Weeks    Status  New    Target Date  05/23/19  PT SHORT TERM GOAL #2   Title  Patient will demonstrate functional recruitment of TA with breathing, sit-to-stand, squatting/lifting, and walking to allow for improved pelvic brace coordination, improved balance, and decreased downward pressure on the pelvic organs.    Baseline  Pt. having leakage with transitions and coughing, pelvic pain with standing still    Time  5    Period  Weeks    Status  New    Target Date  05/23/19      PT SHORT TERM GOAL #3   Title  Patient will report consistent use of foot-stool (squatty-potty) for positioning with BM to decrease pain with BM and intra-abdominal pressure.    Baseline  Has IBS, straining to have a BM, currently would be constipated but staying regular with medication management.    Time  5    Period  Weeks    Status  New    Target Date  05/23/19      PT SHORT TERM GOAL #4   Title  Pt. will be able to successfully implement urge-suppression tecnique within the home and pre-squeeze-and sneeze technique to decrease SUI and begin improving coordination/timing of the PFM    Baseline  Pt. has leakage with cough every time, keeps a towel on her chair to catch leakage, running to 22 steps to her toilet when she has an urge.    Time   5    Period  Weeks    Status  New    Target Date  05/23/19      PT SHORT TERM GOAL #5   Title  Patient will report a reduction in pain to no greater than 6/10 over the prior week to demonstrate symptom improvement.    Baseline  Pain reaches 10/10 in the back with standing still or activity, and 8/10 in the pelvis with straining.    Time  5    Period  Weeks    Status  New    Target Date  05/23/19        PT Long Term Goals - 04/18/19 1421      PT LONG TERM GOAL #1   Title  Patient will report no episodes of SUI/UUI of FI over the course of the prior two weeks to demonstrate improved functional ability.    Baseline  Pt. not leaving her house due to incontinence other than for doctor's appointments/therapy.    Time  10    Period  Weeks    Status  New    Target Date  06/27/19      PT LONG TERM GOAL #2   Title  Patient will describe feeling of pressure no more than 10% of the time over the course of the past week to demonstrate improved recruitment and strength of the pelvic floor.    Baseline  Pt describes "feeling like she is sitting on a corn-cob" all the time.    Time  10    Period  Weeks    Status  New    Target Date  06/27/19      PT LONG TERM GOAL #3   Title  Pt. will improve in FOTO scores by at least 15 points to demonstrate improved function.    Baseline  Urinary: 42 Bowel: 41 Cnst: 47    Time  10    Period  Weeks    Status  New    Target Date  06/27/19      PT LONG TERM GOAL #4  Title  Patient will report having BM's at least every-other day with consistency between [facility] stool scale 3-5 over the prior week to demonstrate decreased constipation.    Baseline  IBS, currently more constipated, went 3 weeks without BM and did not realize.    Time  10    Period  Weeks    Status  New    Target Date  06/27/19      PT LONG TERM GOAL #5   Title  Pt. will report feeling confident enough to go grocery shopping and out to a friend/family members home for improved social  contact, improved function, and increased QOL.    Baseline  Pt. stopped leaving the home because of fear of having an accident.    Time  10    Period  Weeks    Status  New    Target Date  06/27/19      Additional Long Term Goals   Additional Long Term Goals  Yes      PT LONG TERM GOAL #6   Title  Patient will describe pain no greater than 3/10 during and after 30+min. of exercise, cooking, shopping, standing etc.  to demonstrate improved functional ability.    Baseline  Pt. has pain that becomes unbearable with standing >15 min. in the low back and pain in the pelvis with "straining"    Time  10    Period  Weeks    Status  New    Target Date  06/27/19             Plan - 04/18/19 1403    Clinical Impression Statement  Pt. is a 59 y/o female who presents today with cheif c/o POP, mixed incontinence and FI as well as LBP and pelvic pain. Her PMH is significant for COPD, Asthma, obesity, Hx. Of 6 vaginal deliveries, Hysterectomy and oophrectomy, Bladder sling, anal sphincterotomy, C-section, and Appendectomy. Her clinical exam revealed a LLD with the RLE long by ~ 1cm, a R-lumbar, L-thoracic scoliotic curve, decreased lumbosacral and thoracic mobility, breathing dysfunction, pain in B feet, and spasms surrounding the pelvis as well as Hx. strongly suggesting PFM spasms with weakness due to insufficient length-tension relationship. She will benefit from skilled pelvic health PT to address the noted deficits and to continue to assess for and address other potential causes of Sx.    Personal Factors and Comorbidities  Behavior Pattern;Comorbidity 3+    Comorbidities  COPD, Asthma, obesity, Hx. Of 6 vaginal deliveries, Hysterectomy and oophrectomy, Bladder sling, anal sphincterotomy, C-section, and Appendectomy    Examination-Activity Limitations  Locomotion Level;Squat;Sit;Transfers;Lift;Bend;Caring for Others;Continence;Toileting;Carry;Stand    Examination-Participation [employer];Interpersonal Relationship;Yard Work;Cleaning;Laundry;Community Activity;Meal Prep;Shop    Stability/Clinical Decision Making  Unstable/Unpredictable    Clinical Decision Making  High    Rehab Potential  Good    PT Frequency  1x / week    PT Duration  Other (comment)   10 weeks   PT Treatment/Interventions  ADLs/Self Care Home Management;Biofeedback;Aquatic Therapy;Moist Heat;Electrical Stimulation;Traction;Cryotherapy;Ultrasound;Therapeutic activities;Gait training;Stair training;Functional mobility training;Therapeutic exercise;Balance training;Neuromuscular re-education;Orthotic Fit/Training;Patient/family education;Manual techniques;Scar mobilization;Taping;Energy conservation;Dry needling;Passive range of motion;Joint Manipulations;Spinal Manipulations    PT Next Visit Plan  Spasm reduction/gently sacral mobs, give heel-lift, pelvic tilts, LB stretch, hip-flexor stretch, and seated posture    PT Home Exercise Plan  Diaphragmatic breathing, side-stretch    Consulted and Agree with Plan of Care  Patient       Patient will benefit from skilled therapeutic intervention in order to improve the following deficits  and impairments:  Abnormal gait, Decreased balance, Decreased endurance, Decreased mobility, Difficulty walking, Hypomobility, Increased muscle spasms, Cardiopulmonary status limiting activity, Decreased range of motion, Decreased scar mobility, Improper body mechanics, Obesity, Decreased activity tolerance, Decreased coordination, Decreased strength, Increased fascial restricitons, Impaired flexibility, Postural dysfunction, Pain  Visit Diagnosis: Other idiopathic scoliosis, thoracolumbar region  Sacrococcygeal disorders, not elsewhere classified  Other muscle spasm  Other lack of coordination     Problem List Patient Active Problem List   Diagnosis Date Noted  . 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 Cessation of tobacco use in previous 12 months 02/20/2019  . Seasonal  allergies 02/20/2019  . Hypoxia   . SOB (shortness of breath)   . COPD with acute exacerbation ([place])   . Acute on chronic respiratory failure with hypoxemia ([place]) 01/04/2019  . Chronic bronchitis ([place]) 05/08/2018  . Anxiety 05/08/2018  . Depression 05/08/2018  . Compressed cervical disc 05/08/2018  . Carpal tunnel syndrome on both sides 05/08/2018  . Chronic sinusitis 05/08/2018  . IBS (irritable bowel syndrome) 05/08/2018   Willa Rough DPT, ATC Willa Rough 04/18/2019, 5:06 PM  [place] [facility] SERVICES [address] [place], [place], [address] Phone: [phone]   Fax:  [phone]  Name: Terri Hughes MRN: [id number] Date of Birth: 11-Mar-1960

## 2019-04-18 NOTE — Patient Instructions (Signed)
Stabilization: Diaphragmatic Breathing    Lie with knees bent, feet flat. Place one hand on stomach, other on chest. Breathe deeply through nose, lifting belly hand without any motion of hand on chest. AT LEAST 5 min. Per night, but as much as you can throughout the day.   You do not have to sit on the floor!!!  Hold for 30 seconds (5 deep breaths) and repeat 2-3 times on each side once a day

## 2019-04-19 ENCOUNTER — Other Ambulatory Visit: Payer: Self-pay

## 2019-04-19 ENCOUNTER — Encounter: Payer: Medicaid Other | Admitting: *Deleted

## 2019-04-19 DIAGNOSIS — J449 Chronic obstructive pulmonary disease, unspecified: Secondary | ICD-10-CM

## 2019-04-19 NOTE — Progress Notes (Signed)
Daily Session Note  Patient Details  Name: Terri Hughes MRN: [id number] Date of Birth: [birth year] Referring Provider:     Pulmonary Rehab from 03/22/2019 in [facility]  Referring Provider  Thad Ranger MD      Encounter Date: 04/19/2019  Check In: Session Check In - 04/19/19 1115      Check-In   Supervising physician immediately available to respond to emergencies  See telemetry face sheet for immediately available ER MD    Location  [facility]-Cardiac & Pulmonary Rehab    Staff Present  Hope Budds RDN, LDN;Maryalice Pasley Sherryll Burger, RN BSN;Joseph Hood RCP,RRT,BSRT    Virtual Visit  No    Medication changes reported      No    Fall or balance concerns reported     No    Warm-up and Cool-down  Performed on first and last piece of equipment    Resistance Training Performed  Yes    VAD Patient?  No    PAD/SET Patient?  No      Pain Assessment   Currently in Pain?  No/denies          Social History   Tobacco Use  Smoking Status Former Smoker  . Packs/day: 3.00  . Years: 47.00  . Pack years: 141.00  . Types: Cigarettes  . Start date: 04/18/1971  . Quit date: 01/02/2019  . Years since quitting: 0.2  Smokeless Tobacco Never Used    Goals Met:  Independence with exercise equipment Exercise tolerated well No report of cardiac concerns or symptoms Strength training completed today  Goals Unmet:  Not Applicable  Comments: Pt able to follow exercise prescription today without complaint.  Will continue to monitor for progression.    Dr. Emily Filbert is Medical Director for [place] and [facility].

## 2019-04-24 ENCOUNTER — Encounter: Payer: Medicaid Other | Admitting: *Deleted

## 2019-04-24 ENCOUNTER — Ambulatory Visit: Payer: Medicaid Other

## 2019-04-24 ENCOUNTER — Other Ambulatory Visit: Payer: Self-pay

## 2019-04-24 DIAGNOSIS — M533 Sacrococcygeal disorders, not elsewhere classified: Secondary | ICD-10-CM

## 2019-04-24 DIAGNOSIS — M62838 Other muscle spasm: Secondary | ICD-10-CM

## 2019-04-24 DIAGNOSIS — J449 Chronic obstructive pulmonary disease, unspecified: Secondary | ICD-10-CM

## 2019-04-24 DIAGNOSIS — M4125 Other idiopathic scoliosis, thoracolumbar region: Secondary | ICD-10-CM | POA: Diagnosis not present

## 2019-04-24 DIAGNOSIS — R278 Other lack of coordination: Secondary | ICD-10-CM

## 2019-04-24 NOTE — Progress Notes (Signed)
Daily Session Note  Patient Details  Name: Terri Hughes MRN: [id number] Date of Birth: 1960-06-21 Referring Provider:     Pulmonary Rehab from 03/22/2019 in [facility] and Pulmonary Rehab  Referring Provider  Thad Ranger MD      Encounter Date: 04/24/2019  Check In: Session Check In - 04/24/19 1111      Check-In   Supervising physician immediately available to respond to emergencies  See telemetry face sheet for immediately available ER MD    Location  [facility]-Cardiac & Pulmonary Rehab    Staff Present  Alberteen Sam, MA, RCEP, CCRP, CCET;Amanda Sommer, BA, ACSM CEP, Exercise Physiologist;Ambyr Qadri Sherryll Burger, RN BSN;Melissa Caiola RDN, LDN    Virtual Visit  No    Medication changes reported      No    Fall or balance concerns reported     No    Warm-up and Cool-down  Performed on first and last piece of equipment    Resistance Training Performed  Yes    VAD Patient?  No      Pain Assessment   Currently in Pain?  No/denies          Social History   Tobacco Use  Smoking Status Former Smoker  . Packs/day: 3.00  . Years: 47.00  . Pack years: 141.00  . Types: Cigarettes  . Start date: 04/18/1971  . Quit date: 01/02/2019  . Years since quitting: 0.3  Smokeless Tobacco Never Used    Goals Met:  Independence with exercise equipment Exercise tolerated well No report of cardiac concerns or symptoms Strength training completed today  Goals Unmet:  Not Applicable  Comments: Pt able to follow exercise prescription today without complaint.  Will continue to monitor for progression.    Dr. Emily Filbert is Medical Director for [place] and [facility].

## 2019-04-24 NOTE — Patient Instructions (Addendum)
Pelvic Tilt With Pelvic Floor (Hook-Lying)        Lie with hips and knees bent. Squeeze pelvic floor and flatten low back while breathing out so that pelvis tilts. Repeat _2x15__ times. Do _1-3__ times a day.  Put a pillow under your hips in this position when doing this exercise to help decrease pressure in the pelvis when it feels "heavy".    Tuck your hips under, then keep the tuck as you lean forward so you feel a stretch through your low back. Hold for 5 deep breaths, repeat 2-3 times, 1-2 times per day.    Do 2 sets of 15 tilts per day. Breathe in when you tilt forward (A) and out when you tuck under (B).   As you start wearing your heel-lift only wear it for an hour the first day and increase by an hour each day so you can allow for the body to adapt to the change easily without much pain. If your pain increases by more than 1-2 points, back off slightly or slow down how quickly you increase your wear time. Once you reach a full day of wear, use it as much as possible forever, even in house-shoes or flip-flops if necessary to keep yourself from reverting to bad pelvic and spinal alignment and having symptoms return.    Adjust-a-lift heel lift Can be found at [email or web address]

## 2019-04-24 NOTE — Therapy (Signed)
[place] MAIN [facility] SERVICES [address] [place], [place], [address] Phone: [phone]   Fax:  [phone]  Physical Therapy Treatment  The patient has been informed of current processes in place at Outpatient Rehab to protect patients from Covid-19 exposure including social distancing, schedule modifications, and new cleaning procedures. After discussing their particular risk with a therapist based on the patient's personal risk factors, the patient has decided to proceed with in-person therapy.   Patient Details  Name: Terri Hughes MRN: [id number] Date of Birth: 1960-11-16 No data recorded  Encounter Date: 04/24/2019  PT End of Session - 04/24/19 1151    Visit Number  2    Number of Visits  10    Date for PT Re-Evaluation  06/27/19    Authorization Type  Mcaid    Authorization - Visit Number  2    Authorization - Number of Visits  4    PT Start Time  0915    PT Stop Time  1030    PT Time Calculation (min)  75 min    Activity Tolerance  Patient tolerated treatment well;No increased pain    Behavior During Therapy  WFL for tasks assessed/performed       Past Medical History:  Diagnosis Date  . Asthma   . CHF (congestive heart failure) ([place])     Past Surgical History:  Procedure Laterality Date  . ABDOMINAL HYSTERECTOMY    . APPENDECTOMY    . CESAREAN SECTION      There were no vitals filed for this visit.    Pelvic Floor Physical Therapy Treatment Note  SCREENING  Changes in medications, allergies, or medical history?: none    SUBJECTIVE  Patient reports: No changes other than her lower back is hurting worse, thinks it may be the cold weather. "when we don't know we just blame the weather"  Precautions:  COPD, Asthma, obesity, Hx. Of Hysterectomy and oophrectomy. Bladder sling. Appendectomy  Sexual activity/pain: Hx. Of pain with intercourse   Location of pain:lower abdomen, pelvic Current pain: 0/10   Max pain: 8/10 Least pain: 0/10 Nature of pain:spasms  Location of pain: LBP Current pain: 6/10  Max pain: 9/10 Least pain: 4/10 Nature of pain:constant, sharp and dull  **2/10 in low back following treatment  Patient Goals: Be able to get out of the house to get groceries or socialize without having an accident.    OBJECTIVE  Changes in: Posture/Observations:    Range of Motion/Flexibilty:    Strength/MMT:  LE MMT:  Pelvic floor:  Abdominal:   Palpation:  Gait Analysis:  INTERVENTIONS THIS SESSION: Manual: Performed TP release to L QL, B Psoas followed by L up-slip correction to decrease spasm and pain and allow for improved pelvic alignment and balance of musculature for improved function and decreased symptoms.   Therex: educated on and practiced posterior pelvic tilts in supine and seated as well as seated low-back stretch to improve strength of muscles opposing tight musculature to allow reciprocal inhibition to improve balance of musculature surrounding the pelvis and improve overall posture for optimal musculature length-tension relationship and function and to maintain and improve muscle length and allow for improved balance of musculature for long-term symptom relief.  Self-care: Educated on and practiced how to perform supine-to -sit transfer to decrease POP and given heel-lift in L shoe and instructed on how to gradually increase wear time to allow her body to accept it.   Total time: 75 min.  PT Short Term Goals - 04/18/19 1414      PT SHORT TERM GOAL #1   Title  Patient will demonstrate improved pelvic alignment and balance of musculature surrounding the pelvis to facilitate decreased PFM spasms and decrease pelvic pain.    Baseline  RLE long, spasms surrounding pelvis and LB.    Time  5    Period  Weeks    Status  New    Target Date  05/23/19      PT SHORT TERM GOAL #2   Title  Patient  will demonstrate functional recruitment of TA with breathing, sit-to-stand, squatting/lifting, and walking to allow for improved pelvic brace coordination, improved balance, and decreased downward pressure on the pelvic organs.    Baseline  Pt. having leakage with transitions and coughing, pelvic pain with standing still    Time  5    Period  Weeks    Status  New    Target Date  05/23/19      PT SHORT TERM GOAL #3   Title  Patient will report consistent use of foot-stool (squatty-potty) for positioning with BM to decrease pain with BM and intra-abdominal pressure.    Baseline  Has IBS, straining to have a BM, currently would be constipated but staying regular with medication management.    Time  5    Period  Weeks    Status  New    Target Date  05/23/19      PT SHORT TERM GOAL #4   Title  Pt. will be able to successfully implement urge-suppression tecnique within the home and pre-squeeze-and sneeze technique to decrease SUI and begin improving coordination/timing of the PFM    Baseline  Pt. has leakage with cough every time, keeps a towel on her chair to catch leakage, running to 22 steps to her toilet when she has an urge.    Time  5    Period  Weeks    Status  New    Target Date  05/23/19      PT SHORT TERM GOAL #5   Title  Patient will report a reduction in pain to no greater than 6/10 over the prior week to demonstrate symptom improvement.    Baseline  Pain reaches 10/10 in the back with standing still or activity, and 8/10 in the pelvis with straining.    Time  5    Period  Weeks    Status  New    Target Date  05/23/19        PT Long Term Goals - 04/18/19 1421      PT LONG TERM GOAL #1   Title  Patient will report no episodes of SUI/UUI of FI over the course of the prior two weeks to demonstrate improved functional ability.    Baseline  Pt. not leaving her house due to incontinence other than for doctor's appointments/therapy.    Time  10    Period  Weeks    Status  New     Target Date  06/27/19      PT LONG TERM GOAL #2   Title  Patient will describe feeling of pressure no more than 10% of the time over the course of the past week to demonstrate improved recruitment and strength of the pelvic floor.    Baseline  Pt describes "feeling like she is sitting on a corn-cob" all the time.    Time  10    Period  Weeks    Status  New    Target Date  06/27/19      PT LONG TERM GOAL #3   Title  Pt. will improve in FOTO scores by at least 15 points to demonstrate improved function.    Baseline  Urinary: 42 Bowel: 41 Cnst: 47    Time  10    Period  Weeks    Status  New    Target Date  06/27/19      PT LONG TERM GOAL #4   Title  Patient will report having BM's at least every-other day with consistency between [facility] stool scale 3-5 over the prior week to demonstrate decreased constipation.    Baseline  IBS, currently more constipated, went 3 weeks without BM and did not realize.    Time  10    Period  Weeks    Status  New    Target Date  06/27/19      PT LONG TERM GOAL #5   Title  Pt. will report feeling confident enough to go grocery shopping and out to a friend/family members home for improved social contact, improved function, and increased QOL.    Baseline  Pt. stopped leaving the home because of fear of having an accident.    Time  10    Period  Weeks    Status  New    Target Date  06/27/19      Additional Long Term Goals   Additional Long Term Goals  Yes      PT LONG TERM GOAL #6   Title  Patient will describe pain no greater than 3/10 during and after 30+min. of exercise, cooking, shopping, standing etc.  to demonstrate improved functional ability.    Baseline  Pt. has pain that becomes unbearable with standing >15 min. in the low back and pain in the pelvis with "straining"    Time  10    Period  Weeks    Status  New    Target Date  06/27/19            Plan - 04/24/19 1153    Clinical Impression Statement  Pt. Responded well to all  interventions today, demonstrating improved pelvic alignment, decreased spasms, improved coordination of muscles surrounding the pelvis, decreased LBP, as well as understanding and correct performance of all education and exercises provided today. They will continue to benefit from skilled physical therapy to work toward remaining goals and maximize function as well as decrease likelihood of symptom increase or recurrence.     PT Next Visit Plan  Spasm reduction/gently sacral mobs, hip-flexor stretch, standing and seated posture, review supine-to-long sit, educate on soda-can and sit-to-stand, squat technique.    PT Home Exercise Plan  Diaphragmatic breathing, side-stretch, heel-lift, Posterior pelvic tilts in hook-lying with and without pillow, low back stretch, seated pelvic tilts.    Consulted and Agree with Plan of Care  Patient       Patient will benefit from skilled therapeutic intervention in order to improve the following deficits and impairments:     Visit Diagnosis: Other idiopathic scoliosis, thoracolumbar region  Sacrococcygeal disorders, not elsewhere classified  Other muscle spasm  Other lack of coordination     Problem List Patient Active Problem List   Diagnosis Date Noted  . 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 Cessation of tobacco use in previous 12 months 02/20/2019  . Seasonal allergies 02/20/2019  . Hypoxia   . SOB (shortness of breath)   . COPD with acute exacerbation ([place])   .  Acute on chronic respiratory failure with hypoxemia ([place]) 01/04/2019  . Chronic bronchitis ([place]) 05/08/2018  . Anxiety 05/08/2018  . Depression 05/08/2018  . Compressed cervical disc 05/08/2018  . Carpal tunnel syndrome on both sides 05/08/2018  . Chronic sinusitis 05/08/2018  . IBS (irritable bowel syndrome) 05/08/2018   Willa Rough DPT, ATC Willa Rough 04/24/2019, 12:06 PM  [place] MAIN [facility] SERVICES [address]  [place], [place], [address] Phone: [phone]   Fax:  [phone]  Name: Terri Hughes MRN: [id number] Date of Birth: Oct 26, 1960

## 2019-05-01 ENCOUNTER — Encounter: Payer: Medicaid Other | Admitting: *Deleted

## 2019-05-01 ENCOUNTER — Other Ambulatory Visit: Payer: Self-pay

## 2019-05-01 DIAGNOSIS — J449 Chronic obstructive pulmonary disease, unspecified: Secondary | ICD-10-CM

## 2019-05-01 NOTE — Progress Notes (Signed)
Daily Session Note  Patient Details  Name: Terri Hughes MRN: [id number] Date of Birth: 02-01-1961 Referring Provider:     Pulmonary Rehab from 03/22/2019 in [facility]  Referring Provider  Thad Ranger MD      Encounter Date: 05/01/2019  Check In: Session Check In - 05/01/19 1114      Check-In   Supervising physician immediately available to respond to emergencies  See telemetry face sheet for immediately available ER MD    Location  [facility]    Staff Present  Alberteen Sam, MA, RCEP, CCRP, CCET;Josue Kass [place], RN Vickki Hearing, BA, ACSM CEP, Exercise Physiologist;Melissa Caiola RDN, LDN    Virtual Visit  No    Medication changes reported      No    Fall or balance concerns reported     No    Warm-up and Cool-down  Performed on first and last piece of equipment    Resistance Training Performed  Yes    VAD Patient?  No    PAD/SET Patient?  No      Pain Assessment   Currently in Pain?  No/denies          Social History   Tobacco Use  Smoking Status Former Smoker  . Packs/day: 3.00  . Years: 47.00  . Pack years: 141.00  . Types: Cigarettes  . Start date: 04/18/1971  . Quit date: 01/02/2019  . Years since quitting: 0.3  Smokeless Tobacco Never Used    Goals Met:  Independence with exercise equipment Exercise tolerated well No report of cardiac concerns or symptoms Strength training completed today  Goals Unmet:  Not Applicable  Comments: Pt able to follow exercise prescription today without complaint.  Will continue to monitor for progression.    Dr. Emily Filbert is Medical Director for [place] and [facility].

## 2019-05-02 ENCOUNTER — Encounter: Payer: Self-pay | Admitting: *Deleted

## 2019-05-02 DIAGNOSIS — J449 Chronic obstructive pulmonary disease, unspecified: Secondary | ICD-10-CM

## 2019-05-02 NOTE — Progress Notes (Signed)
Pulmonary Individual Treatment Plan  Patient Details  Name: Terri Hughes MRN: [id number] Date of Birth: 01-28-1961 Referring Provider:     Pulmonary Rehab from 03/22/2019 in [facility]  Referring Provider  Thad Ranger MD      Initial Encounter Date:    Pulmonary Rehab from 03/22/2019 in [facility] Card[facility] and Pulmonary Rehab  Date  03/22/19      Visit Diagnosis: Chronic obstructive pulmonary disease, unspecified COPD type ([place])  Patient's Home Medications on Admission:  Current Outpatient Medications:  .  albuterol (ACCUNEB) 0.63 MG/3ML nebulizer solution, Take 1 ampule by nebulization every 6 (six) hours as needed for wheezing., Disp: , Rfl:  .  albuterol (VENTOLIN HFA) 108 (90 Base) MCG/ACT inhaler, Inhale 1-2 puffs into the lungs every 4 (four) hours as needed for wheezing or shortness of breath., Disp: , Rfl:  .  budesonide-formoterol (SYMBICORT) 160-4.5 MCG/ACT inhaler, Inhale 2 puffs into the lungs daily. , Disp: , Rfl:  .  montelukast (SINGULAIR) 10 MG tablet, Take 10 mg by mouth at bedtime. , Disp: , Rfl:  .  nitrofurantoin, macrocrystal-monohydrate, (MACROBID) 100 MG capsule, Take 1 capsule (100 mg total) by mouth 2 (two) times daily. (Patient not taking: Reported on 04/18/2019), Disp: 10 capsule, Rfl: 0 .  nitrofurantoin, macrocrystal-monohydrate, (MACROBID) 100 MG capsule, Take 1 capsule (100 mg total) by mouth 2 (two) times daily. (Patient not taking: Reported on 04/02/2019), Disp: 10 capsule, Rfl: 0 .  omeprazole (PRILOSEC) 20 MG capsule, Take 20 mg by mouth daily. , Disp: , Rfl:  .  tiotropium (SPIRIVA) 18 MCG inhalation capsule, Place into inhaler and inhale., Disp: , Rfl:   Past Medical History: Past Medical History:  Diagnosis Date  . Asthma   . CHF (congestive heart failure) (HCC)     Tobacco Use: Social History   Tobacco Use  Smoking Status Former Smoker  . Packs/day: 3.00  . Years: 47.00  . Pack years: 141.00  . Types: Cigarettes  .  Start date: 04/18/1971  . Quit date: 01/02/2019  . Years since quitting: 0.3  Smokeless Tobacco Never Used    Labs: Recent Review Flowsheet Data    There is no flowsheet data to display.       Pulmonary Assessment Scores: Pulmonary Assessment Scores    Row Name 03/22/19 0907         ADL UCSD   ADL Phase  Entry     SOB Score total  86     Rest  0     Walk  3     Stairs  5     Bath  4     Dress  4     Shop  5       CAT Score   CAT Score  28       mMRC Score   mMRC Score  4        UCSD: Self-administered rating of dyspnea associated with activities of daily living (ADLs) 6-point scale (0 = "not at all" to 5 = "maximal or unable to do because of breathlessness")  Scoring Scores range from 0 to 120.  Minimally important difference is 5 units  CAT: CAT can identify the health impairment of COPD patients and is better correlated with disease progression.  CAT has a scoring range of zero to 40. The CAT score is classified into four groups of low (less than 10), medium (10 - 20), high (21-30) and very high (31-40) based on the impact  level of disease on health status. A CAT score over 10 suggests significant symptoms.  A worsening CAT score could be explained by an exacerbation, poor medication adherence, poor inhaler technique, or progression of COPD or comorbid conditions.  CAT MCID is 2 points  mMRC: mMRC (Modified Medical Research Council) Dyspnea Scale is used to assess the degree of baseline functional disability in patients of respiratory disease due to dyspnea. No minimal important difference is established. A decrease in score of 1 point or greater is considered a positive change.   Pulmonary Function Assessment: Pulmonary Function Assessment - 03/22/19 0927      Initial Spirometry Results   FVC%  77 %   02/07/2019   FEV1%  53 %    FEV1/FVC Ratio  55      Post Bronchodilator Spirometry Results   FVC%  81 %    FEV1%  58 %    FEV1/FVC Ratio  57      Breath    Shortness of Breath  Yes;Fear of Shortness of Breath;Limiting activity;Panic with Shortness of Breath       Exercise Target Goals: Exercise Program Goal: Individual exercise prescription set using results from initial 6 min walk test and THRR while considering  patient's activity barriers and safety.   Exercise Prescription Goal: Initial exercise prescription builds to 30-45 minutes a day of aerobic activity, 2-3 days per week.  Home exercise guidelines will be given to patient during program as part of exercise prescription that the participant will acknowledge.  Activity Barriers & Risk Stratification: Activity Barriers & Cardiac Risk Stratification - 03/21/19 1111      Activity Barriers & Cardiac Risk Stratification   Activity Barriers  Joint Problems;Other (comment);Deconditioning;Muscular Weakness;Shortness of Breath    Comments  occasional bilateral foot cramps on top of foot after being on feet for prolonged period of time (uses [employer] to treat pain)       6 Minute Walk: 6 Minute Walk    Row Name 03/22/19 0858         6 Minute Walk   Phase  Initial     Distance  1200 feet     Walk Time  6 minutes     # of Rest Breaks  0     MPH  2.27     METS  2.33     RPE  15     Perceived Dyspnea   4     VO2 Peak  8.14     Symptoms  Yes (comment)     Comments  dizzy (improved with rest), back pain 6/10, SOB     Resting HR  75 bpm     Resting BP  114/66     Resting Oxygen Saturation   96 %     Exercise Oxygen Saturation  during 6 min walk  87 %     Max Ex. HR  117 bpm     Max Ex. BP  144/70     2 Minute Post BP  136/72       Interval HR   1 Minute HR  100     2 Minute HR  110     3 Minute HR  116     4 Minute HR  117     5 Minute HR  115     6 Minute HR  109     2 Minute Post HR  79     Interval Heart Rate?  Yes  Interval Oxygen   Interval Oxygen?  Yes     Baseline Oxygen Saturation %  96 %     1 Minute Oxygen Saturation %  88 %     1 Minute Liters of  Oxygen  0 L Room Air     2 Minute Oxygen Saturation %  87 %     2 Minute Liters of Oxygen  0 L     3 Minute Oxygen Saturation %  88 %     3 Minute Liters of Oxygen  0 L     4 Minute Oxygen Saturation %  89 %     4 Minute Liters of Oxygen  0 L     5 Minute Oxygen Saturation %  87 %     5 Minute Liters of Oxygen  0 L     6 Minute Oxygen Saturation %  88 %     6 Minute Liters of Oxygen  0 L     2 Minute Post Oxygen Saturation %  94 %     2 Minute Post Liters of Oxygen  0 L       Oxygen Initial Assessment: Oxygen Initial Assessment - 03/21/19 1116      Home Oxygen   Home Oxygen Device  None    Sleep Oxygen Prescription  CPAP   just completed sleep study   Home Exercise Oxygen Prescription  None    Home at Rest Exercise Oxygen Prescription  None    Compliance with Home Oxygen Use  Yes      Initial 6 min Walk   Oxygen Used  None      Program Oxygen Prescription   Program Oxygen Prescription  None      Intervention   Short Term Goals  To learn and exhibit compliance with exercise, home and travel O2 prescription;To learn and understand importance of monitoring SPO2 with pulse oximeter and demonstrate accurate use of the pulse oximeter.;To learn and understand importance of maintaining oxygen saturations>88%;To learn and demonstrate proper pursed lip breathing techniques or other breathing techniques.;To learn and demonstrate proper use of respiratory medications    Long  Term Goals  Exhibits compliance with exercise, home and travel O2 prescription;Verbalizes importance of monitoring SPO2 with pulse oximeter and return demonstration;Maintenance of O2 saturations>88%;Exhibits proper breathing techniques, such as pursed lip breathing or other method taught during program session;Compliance with respiratory medication;Demonstrates proper use of MDI's       Oxygen Re-Evaluation: Oxygen Re-Evaluation    Row Name 03/29/19 0823 04/17/19 1146           Program Oxygen Prescription    Program Oxygen Prescription  None  None        Home Oxygen   Home Oxygen Device  None  None      Sleep Oxygen Prescription  CPAP  CPAP      Home Exercise Oxygen Prescription  None  None      Home at Rest Exercise Oxygen Prescription  None  None      Compliance with Home Oxygen Use  Yes  Yes        Goals/Expected Outcomes   Short Term Goals  To learn and demonstrate proper pursed lip breathing techniques or other breathing techniques.;To learn and understand importance of monitoring SPO2 with pulse oximeter and demonstrate accurate use of the pulse oximeter.;To learn and understand importance of maintaining oxygen saturations>88%  To learn and demonstrate proper pursed lip breathing techniques or other breathing techniques.;To learn and  understand importance of monitoring SPO2 with pulse oximeter and demonstrate accurate use of the pulse oximeter.;To learn and understand importance of maintaining oxygen saturations>88%      Long  Term Goals  Maintenance of O2 saturations>88%;Exhibits proper breathing techniques, such as pursed lip breathing or other method taught during program session;Verbalizes importance of monitoring SPO2 with pulse oximeter and return demonstration  Maintenance of O2 saturations>88%;Exhibits proper breathing techniques, such as pursed lip breathing or other method taught during program session;Verbalizes importance of monitoring SPO2 with pulse oximeter and return demonstration      Comments  Short: Use RPE daily to regulate intensity. Long: Follow program prescription in THR.  Terri Hughes is compliant with her CPAP but is still having some bad breathing days.  She works on using her PLB those days and less active. She is using her pulse oximeter at home and has noted on these days that it drops to 87-84% but she has not told the doctor yet.  She has had sleep study with desaturation but not enough for oxygen therapy. She has noted that it drops with weight bearing activity and may  benefit from oxygen.  We talked about talking with her doctor about it.      Goals/Expected Outcomes  Short: Become more profiecient at using PLB.   Long: Become independent at using PLB.  Short: Contact pulmonologist about sats  Long: Continue to use PLB and managing breathing.         Oxygen Discharge (Final Oxygen Re-Evaluation): Oxygen Re-Evaluation - 04/17/19 1146      Program Oxygen Prescription   Program Oxygen Prescription  None      Home Oxygen   Home Oxygen Device  None    Sleep Oxygen Prescription  CPAP    Home Exercise Oxygen Prescription  None    Home at Rest Exercise Oxygen Prescription  None    Compliance with Home Oxygen Use  Yes      Goals/Expected Outcomes   Short Term Goals  To learn and demonstrate proper pursed lip breathing techniques or other breathing techniques.;To learn and understand importance of monitoring SPO2 with pulse oximeter and demonstrate accurate use of the pulse oximeter.;To learn and understand importance of maintaining oxygen saturations>88%    Long  Term Goals  Maintenance of O2 saturations>88%;Exhibits proper breathing techniques, such as pursed lip breathing or other method taught during program session;Verbalizes importance of monitoring SPO2 with pulse oximeter and return demonstration    Comments  Terri Hughes is compliant with her CPAP but is still having some bad breathing days.  She works on using her PLB those days and less active. She is using her pulse oximeter at home and has noted on these days that it drops to 87-84% but she has not told the doctor yet.  She has had sleep study with desaturation but not enough for oxygen therapy. She has noted that it drops with weight bearing activity and may benefit from oxygen.  We talked about talking with her doctor about it.    Goals/Expected Outcomes  Short: Contact pulmonologist about sats  Long: Continue to use PLB and managing breathing.       Initial Exercise Prescription: Initial Exercise  Prescription - 03/22/19 0900      Date of Initial Exercise RX and Referring Provider   Date  03/22/19    Referring Provider  Thad Ranger MD      Treadmill   MPH  1.7    Grade  0    Minutes  15    METs  2.3      NuStep   Level  1    SPM  80    Minutes  15    METs  2      Biostep-RELP   Level  1    SPM  50    Minutes  15    METs  2      Prescription Details   Frequency (times per week)  1    Duration  Progress to 30 minutes of continuous aerobic without signs/symptoms of physical distress      Intensity   THRR 40-80% of Max Heartrate  110-145    Ratings of Perceived Exertion  11-13    Perceived Dyspnea  0-4      Progression   Progression  Continue to progress workloads to maintain intensity without signs/symptoms of physical distress.      Resistance Training   Training Prescription  Yes    Weight  3 lbs    Reps  10-15       Perform Capillary Blood Glucose checks as needed.  Exercise Prescription Changes: Exercise Prescription Changes    Row Name 03/22/19 0900 04/02/19 1500 04/18/19 1000 05/01/19 1300       Response to Exercise   Blood Pressure (Admit)  114/66  146/64  122/70  124/70    Blood Pressure (Exercise)  144/70  144/72  126/62  132/74    Blood Pressure (Exit)  124/70  132/80  118/58  110/66    Heart Rate (Admit)  75 bpm  71 bpm  88 bpm  70 bpm    Heart Rate (Exercise)  117 bpm  125 bpm  95 bpm  106 bpm    Heart Rate (Exit)  76 bpm  84 bpm  77 bpm  79 bpm    Oxygen Saturation (Admit)  96 %  --  96 %  98 %    Oxygen Saturation (Exercise)  87 %  --  94 %  93 %    Oxygen Saturation (Exit)  94 %  --  93 %  93 %    Rating of Perceived Exertion (Exercise)  '15  15  13  13    '$ Perceived Dyspnea (Exercise)  '4  3  2  2    '$ Symptoms  dizzy, back pain (6/10), SOB  --  SOB  --    Comments  walk test results  --  --  --    Duration  --  Continue with 30 min of aerobic exercise without signs/symptoms of physical distress.  Continue with 30 min of aerobic  exercise without signs/symptoms of physical distress.  Progress to 30 minutes of  aerobic without signs/symptoms of physical distress    Intensity  --  THRR unchanged  THRR unchanged  THRR unchanged      Progression   Progression  --  Continue to progress workloads to maintain intensity without signs/symptoms of physical distress.  Continue to progress workloads to maintain intensity without signs/symptoms of physical distress.  Continue to progress workloads to maintain intensity without signs/symptoms of physical distress.    Average METs  --  2.7  2.23  2.1      Resistance Training   Training Prescription  --  Yes  Yes  Yes    Weight  --  3 lb  3 lb  3 lb    Reps  --  10-15  10-15  10-15      Interval  Training   Interval Training  --  --  No  No      Bike   Level  --  3  2.3  --    Minutes  --  15  15  --    METs  --  2.7  2.37  --      NuStep   Level  --  --  3  4    SPM  --  --  --  80    Minutes  --  --  15  15    METs  --  --  1.8  2.2      Arm Ergometer   Level  --  1  1  --    Minutes  --  15  15  --    METs  --  --  2.4  --      Biostep-RELP   Level  --  --  1  2    SPM  --  --  --  50    Minutes  --  --  15  15    METs  --  --  2  2      Home Exercise Plan   Plans to continue exercise at  --  --  Home (comment) walking, staff videos  Home (comment) walking, staff videos    Frequency  --  --  Add 2 additional days to program exercise sessions.  Add 2 additional days to program exercise sessions.    Initial Home Exercises Provided  --  --  04/17/19  04/17/19       Exercise Comments: Exercise Comments    Row Name 03/29/19 [phone]           Exercise Comments  First full day of exercise!  Patient was oriented to gym and equipment including functions, settings, policies, and procedures.  Patient's individual exercise prescription and treatment plan were reviewed.  All starting workloads were established based on the results of the 6 minute walk test done at  initial orientation visit.  The plan for exercise progression was also introduced and progression will be customized based on patient's performance and goals..          Exercise Goals and Review: Exercise Goals    Row Name 03/22/19 0904             Exercise Goals   Increase Physical Activity  Yes       Intervention  Provide advice, education, support and counseling about physical activity/exercise needs.;Develop an individualized exercise prescription for aerobic and resistive training based on initial evaluation findings, risk stratification, comorbidities and participant's personal goals.       Expected Outcomes  Short Term: Attend rehab on a regular basis to increase amount of physical activity.;Long Term: Add in home exercise to make exercise part of routine and to increase amount of physical activity.;Long Term: Exercising regularly at least 3-5 days a week.       Increase Strength and Stamina  Yes       Intervention  Provide advice, education, support and counseling about physical activity/exercise needs.       Expected Outcomes  Short Term: Increase workloads from initial exercise prescription for resistance, speed, and METs.;Short Term: Perform resistance training exercises routinely during rehab and add in resistance training at home;Long Term: Improve cardiorespiratory fitness, muscular endurance and strength as measured by increased METs and functional capacity (6MWT)       Able to understand  and use rate of perceived exertion (RPE) scale  Yes       Intervention  Provide education and explanation on how to use RPE scale       Expected Outcomes  Short Term: Able to use RPE daily in rehab to express subjective intensity level;Long Term:  Able to use RPE to guide intensity level when exercising independently       Able to understand and use Dyspnea scale  Yes       Intervention  Provide education and explanation on how to use Dyspnea scale       Expected Outcomes  Short Term: Able to  use Dyspnea scale daily in rehab to express subjective sense of shortness of breath during exertion;Long Term: Able to use Dyspnea scale to guide intensity level when exercising independently       Knowledge and understanding of Target Heart Rate Range (THRR)  Yes       Intervention  Provide education and explanation of THRR including how the numbers were predicted and where they are located for reference       Expected Outcomes  Short Term: Able to state/look up THRR;Short Term: Able to use daily as guideline for intensity in rehab;Long Term: Able to use THRR to govern intensity when exercising independently       Able to check pulse independently  Yes       Intervention  Provide education and demonstration on how to check pulse in carotid and radial arteries.;Review the importance of being able to check your own pulse for safety during independent exercise       Expected Outcomes  Short Term: Able to explain why pulse checking is important during independent exercise;Long Term: Able to check pulse independently and accurately       Understanding of Exercise Prescription  Yes       Intervention  Provide education, explanation, and written materials on patient's individual exercise prescription       Expected Outcomes  Short Term: Able to explain program exercise prescription;Long Term: Able to explain home exercise prescription to exercise independently          Exercise Goals Re-Evaluation : Exercise Goals Re-Evaluation    Row Name 03/29/19 2248 04/17/19 1146 05/01/19 1338 05/01/19 1339       Exercise Goal Re-Evaluation   Exercise Goals Review  Able to understand and use Dyspnea scale;Able to understand and use rate of perceived exertion (RPE) scale;Knowledge and understanding of Target Heart Rate Range (THRR);Able to check pulse independently;Understanding of Exercise Prescription  Increase Physical Activity;Increase Strength and Stamina;Understanding of Exercise Prescription  Increase Physical  Activity;Increase Strength and Stamina;Able to understand and use rate of perceived exertion (RPE) scale;Able to understand and use Dyspnea scale;Knowledge and understanding of Target Heart Rate Range (THRR);Able to check pulse independently;Understanding of Exercise Prescription  --    Comments  Reviewed RPE scale, THR and program prescription with pt today.  Pt voiced understanding and was given a copy of goals to take home.  Terri Hughes is off to a good start in rehab.  She is already walking 1-2 miles a day.  --  Terri Hughes is progressing well. She is at level 4 on NS and up to 2.7 mph walking.    Expected Outcomes  Short: Use RPE daily to regulate intensity. Long: Follow program prescription in THR.  Short: Continue to add in exercise at home.  Long: Continue to improve stamina.  --  Short : continue to progress workloads Long: increase  overall stamina       Discharge Exercise Prescription (Final Exercise Prescription Changes): Exercise Prescription Changes - 05/01/19 1300      Response to Exercise   Blood Pressure (Admit)  124/70    Blood Pressure (Exercise)  132/74    Blood Pressure (Exit)  110/66    Heart Rate (Admit)  70 bpm    Heart Rate (Exercise)  106 bpm    Heart Rate (Exit)  79 bpm    Oxygen Saturation (Admit)  98 %    Oxygen Saturation (Exercise)  93 %    Oxygen Saturation (Exit)  93 %    Rating of Perceived Exertion (Exercise)  13    Perceived Dyspnea (Exercise)  2    Duration  Progress to 30 minutes of  aerobic without signs/symptoms of physical distress    Intensity  THRR unchanged      Progression   Progression  Continue to progress workloads to maintain intensity without signs/symptoms of physical distress.    Average METs  2.1      Resistance Training   Training Prescription  Yes    Weight  3 lb    Reps  10-15      Interval Training   Interval Training  No      NuStep   Level  4    SPM  80    Minutes  15    METs  2.2      Biostep-RELP   Level  2    SPM  50     Minutes  15    METs  2      Home Exercise Plan   Plans to continue exercise at  Home (comment)   walking, staff videos   Frequency  Add 2 additional days to program exercise sessions.    Initial Home Exercises Provided  04/17/19       Nutrition:  Target Goals: Understanding of nutrition guidelines, daily intake of sodium '1500mg'$ , cholesterol '200mg'$ , calories 30% from fat and 7% or less from saturated fats, daily to have 5 or more servings of fruits and vegetables.  Biometrics: Pre Biometrics - 03/22/19 0905      Pre Biometrics   Height  5' 2.9" (1.598 m)    Weight  243 lb (110.2 kg)    BMI (Calculated)  43.16    Single Leg Stand  15.81 seconds        Nutrition Therapy Plan and Nutrition Goals: Nutrition Therapy & Goals - 04/05/19 1502      Nutrition Therapy   Diet  Low Na, HH diet    Protein (specify units)  90g    Fiber  25 grams    Whole Grain Foods  3 servings    Saturated Fats  12 max. grams    Fruits and Vegetables  5 servings/day    Sodium  1.5 grams      Personal Nutrition Goals   Nutrition Goal  ST: Add in more vegetables LT: pt wants to lose weight    Comments  trying to keep kcals 1500 and CHO at 150g. Uses boost protein shakes (AM with multivitamin). 2 low CHO tortillas with canadian bacon. piece of fruit. pears with low sugar syrup and cottage cheese or [place] sandwich with white wheat. Pork skins snack or jerky (b/c low CHO). Pork chop, roasted vegetables, and corn. Sugar free jello. no sugar crystal light tea or water. Pt reports having IBS and doesn't like eggs, chicken, and a lot of vegetables.  Talked about sneaking in some vegetables - ex: smoothie and spinach. Discussed HH eating, honoring hunger, and increased needs due to pumonary issues.      Intervention Plan   Intervention  Prescribe, educate and counsel regarding individualized specific dietary modifications aiming towards targeted core components such as weight, hypertension, lipid management,  diabetes, heart failure and other comorbidities.;Nutrition handout(s) given to patient.    Expected Outcomes  Short Term Goal: Understand basic principles of dietary content, such as calories, fat, sodium, cholesterol and nutrients.;Short Term Goal: A plan has been developed with personal nutrition goals set during dietitian appointment.;Long Term Goal: Adherence to prescribed nutrition plan.       Nutrition Assessments: Nutrition Assessments - 03/22/19 0905      MEDFICTS Scores   Pre Score  62       Nutrition Goals Re-Evaluation:   Nutrition Goals Discharge (Final Nutrition Goals Re-Evaluation):   Psychosocial: Target Goals: Acknowledge presence or absence of significant depression and/or stress, maximize coping skills, provide positive support system. Participant is able to verbalize types and ability to use techniques and skills needed for reducing stress and depression.   Initial Review & Psychosocial Screening: Initial Psych Review & Screening - 03/21/19 1118      Initial Review   Current issues with  Current Depression;Current Anxiety/Panic;Current Stress Concerns    Source of Stress Concerns  Unable to participate in former interests or hobbies;Unable to perform yard/household activities;Retirement/disability;Financial;Chronic Illness    Comments  Currently applying for disability (first round was denied), has tried Xanax before, but not currently on it, anti-depressive make suicidal ideations worse, bowel and bladder incontenince (vaginal fault) and has appt with [place]?  Yes   six children (three are local)   Comments  Three of her six children live in the area and has a granddaughter that lives with her to help out.      Barriers   Psychosocial barriers to participate in program  The patient should benefit from training in stress management and relaxation.;There are no identifiable barriers or psychosocial needs.       Screening Interventions   Interventions  Encouraged to exercise;Provide feedback about the scores to participant;To provide support and resources with identified psychosocial needs    Expected Outcomes  Long Term Goal: Stressors or current issues are controlled or eliminated.;Short Term goal: Identification and review with participant of any Quality of Life or Depression concerns found by scoring the questionnaire.;Long Term goal: The participant improves quality of Life and PHQ9 Scores as seen by post scores and/or verbalization of changes       Quality of Life Scores:  Scores of 19 and below usually indicate a poorer quality of life in these areas.  A difference of  2-3 points is a clinically meaningful difference.  A difference of 2-3 points in the total score of the Quality of Life Index has been associated with significant improvement in overall quality of life, self-image, physical symptoms, and general health in studies assessing change in quality of life.  PHQ-9: Recent Review Flowsheet Data    Depression screen [facility] 2/9 04/16/2019 03/22/2019   Decreased Interest 1 1   Down, Depressed, Hopeless 1 2   PHQ - 2 Score 2 3   Altered sleeping 1 3   Tired, decreased energy 2 3   Change in appetite 0 3   Feeling bad or failure about yourself  1 2   Trouble concentrating  1 3   Moving slowly or fidgety/restless 0 0   Suicidal thoughts 0 0   PHQ-9 Score 7 17   Difficult doing work/chores Somewhat difficult Very difficult     Interpretation of Total Score  Total Score Depression Severity:  1-4 = Minimal depression, 5-9 = Mild depression, 10-14 = Moderate depression, 15-19 = Moderately severe depression, 20-27 = Severe depression   Psychosocial Evaluation and Intervention: Psychosocial Evaluation - 03/21/19 1129      Psychosocial Evaluation & Interventions   Interventions  Stress management education;Encouraged to exercise with the program and follow exercise prescription    Comments  Terri Hughes  is coming to pulmonary rehab.  She is coming in for COPD and had a recent hospitalization for an exacerbation and hypoxia.  She has a significant history of depression and anxiety.  She feels that her depression is currently under control but her anxiety is starting to spiral.  In the past she was on xanax for anxiety and it worked and helped her sleep, but she was taken off of it.  She does not do well on anti-depression meds as they seem to make her suicidal ideations worse.  She is currently thinking about seeing someone at the [facility] for help with her anxiety.  She is also hoping that the exercise will help as well.  She is looking forwad to the program.  She wants to learn to be comfortable with exercise and build her confidence back up.  She doesn't want to continue to worry about her heart and sats dropping when she exercises.  She will benefit from exercise from the strength and stamina side as well.  She also has issues with bowel and urinary incontenince that ofter leads to infections.  She is planning to see her OB-GYN about this and is considering pelvic floor therapy as well.    Expected Outcomes  Short: Attend rehab regularly to build confidence in her ability to exercise.  Long: Better management of anxiety.    Continue Psychosocial Services   Follow up required by staff       Psychosocial Re-Evaluation: Psychosocial Re-Evaluation    [place] Name 04/16/19 [phone] 04/17/19 1153           Psychosocial Re-Evaluation   Current issues with  Current Depression  Current Depression      Comments  PHQ 9 repeated  Score decreased 17 to 7 today.  Terri Hughes is doing better now that she is in rehab.  She is still getting frustrated with sats dropping at home, but we made a plan to start to talk to the doctor about it.  We also reviewed  COPD action plan to help manage breathing. Her depression is getting better overall.      Expected Outcomes  STG continues decrease in PHQ 9 score as attends program and  works on healing. LTG: Phq9 in normal range  Short: Continue to attend rehab and talk to doctor.  Long: Work on positive talk and self care.      Interventions  Encouraged to attend Pulmonary Rehabilitation for the exercise  Encouraged to attend Pulmonary Rehabilitation for the exercise;Stress management education;Relaxation education      Continue Psychosocial Services   Follow up required by staff  Follow up required by staff         Psychosocial Discharge (Final Psychosocial Re-Evaluation): Psychosocial Re-Evaluation - 04/17/19 1153      Psychosocial Re-Evaluation   Current issues with  Current Depression    Comments  Terri Hughes is doing better now that she is in rehab.  She is still getting frustrated with sats dropping at home, but we made a plan to start to talk to the doctor about it.  We also reviewed  COPD action plan to help manage breathing. Her depression is getting better overall.    Expected Outcomes  Short: Continue to attend rehab and talk to doctor.  Long: Work on positive talk and self care.    Interventions  Encouraged to attend Pulmonary Rehabilitation for the exercise;Stress management education;Relaxation education    Continue Psychosocial Services   Follow up required by staff       Education: Education Goals: Education classes will be provided on a weekly basis, covering required topics. Participant will state understanding/return demonstration of topics presented.  Learning Barriers/Preferences: Learning Barriers/Preferences - 03/21/19 1113      Learning Barriers/Preferences   Learning Barriers  Reading;Sight   occasional "brain fog", glasses   Learning Preferences  Written Material       Education Topics:  Initial Evaluation Education: - Verbal, written and demonstration of respiratory meds, oximetry and breathing techniques. Instruction on use of nebulizers and MDIs and importance of monitoring MDI activations.   Pulmonary Rehab from 03/22/2019 in [facility] Cardiac  and Pulmonary Rehab  Date  03/22/19  Educator  [facility]  Instruction Review Code  1- Verbalizes Understanding      General Nutrition Guidelines/Fats and Fiber: -Group instruction provided by verbal, written material, models and posters to present the general guidelines for heart healthy nutrition. Gives an explanation and review of dietary fats and fiber.   Controlling Sodium/Reading Food Labels: -Group verbal and written material supporting the discussion of sodium use in heart healthy nutrition. Review and explanation with models, verbal and written materials for utilization of the food label.   Exercise Physiology & General Exercise Guidelines: - Group verbal and written instruction with models to review the exercise physiology of the cardiovascular system and associated critical values. Provides general exercise guidelines with specific guidelines to those with heart or lung disease.    Aerobic Exercise & Resistance Training: - Gives group verbal and written instruction on the various components of exercise. Focuses on aerobic and resistive training programs and the benefits of this training and how to safely progress through these programs.   Flexibility, Balance, Mind/Body Relaxation: Provides group verbal/written instruction on the benefits of flexibility and balance training, including mind/body exercise modes such as yoga, pilates and tai chi.  Demonstration and skill practice provided.   Stress and Anxiety: - Provides group verbal and written instruction about the health risks of elevated stress and causes of high stress.  Discuss the correlation between heart/lung disease and anxiety and treatment options. Review healthy ways to manage with stress and anxiety.   Depression: - Provides group verbal and written instruction on the correlation between heart/lung disease and depressed mood, treatment options, and the stigmas associated with seeking treatment.   Exercise & Equipment  Safety: - Individual verbal instruction and demonstration of equipment use and safety with use of the equipment.   Pulmonary Rehab from 03/22/2019 in [facility] Cardiac and Pulmonary Rehab  Date  03/22/19  Educator  [facility]  Instruction Review Code  1- Verbalizes Understanding      Infection Prevention: - Provides verbal and written material to individual with discussion of infection control including proper hand washing and proper equipment cleaning during exercise session.   Pulmonary Rehab from 03/22/2019 in [facility] Cardiac and Pulmonary Rehab  Date  03/22/19  Educator  [place]  Instruction Review Code  1- Verbalizes Understanding      Falls Prevention: - Provides verbal and written material to individual with discussion of falls prevention and safety.   Pulmonary Rehab from 03/22/2019 in [facility] Cardiac and Pulmonary Rehab  Date  03/22/19  Educator  [facility]  Instruction Review Code  1- Verbalizes Understanding      Diabetes: - Individual verbal and written instruction to review signs/symptoms of diabetes, desired ranges of glucose level fasting, after meals and with exercise. Advice that pre and post exercise glucose checks will be done for 3 sessions at entry of program.   Chronic Lung Diseases: - Group verbal and written instruction to review updates, respiratory medications, advancements in procedures and treatments. Discuss use of supplemental oxygen including available portable oxygen systems, continuous and intermittent flow rates, concentrators, personal use and safety guidelines. Review proper use of inhaler and spacers. Provide informative websites for self-education.    Energy Conservation: - Provide group verbal and written instruction for methods to conserve energy, plan and organize activities. Instruct on pacing techniques, use of adaptive equipment and posture/positioning to relieve shortness of breath.   Triggers and Exacerbations: - Group verbal and written instruction to review types  of environmental triggers and ways to prevent exacerbations. Discuss weather changes, air quality and the benefits of nasal washing. Review warning signs and symptoms to help prevent infections. Discuss techniques for effective airway clearance, coughing, and vibrations.   AED/CPR: - Group verbal and written instruction with the use of models to demonstrate the basic use of the AED with the basic ABC's of resuscitation.   Anatomy and Physiology of the Lungs: - Group verbal and written instruction with the use of models to provide basic lung anatomy and physiology related to function, structure and complications of lung disease.   Anatomy & Physiology of the Heart: - Group verbal and written instruction and models provide basic cardiac anatomy and physiology, with the coronary electrical and arterial systems. Review of Valvular disease and Heart Failure   Cardiac Medications: - Group verbal and written instruction to review commonly prescribed medications for heart disease. Reviews the medication, class of the drug, and side effects.   Know Your Numbers and Risk Factors: -Group verbal and written instruction about important numbers in your health.  Discussion of what are risk factors and how they play a role in the disease process.  Review of Cholesterol, Blood Pressure, Diabetes, and BMI and the role they play in your overall health.   Sleep Hygiene: -Provides group verbal and written instruction about how sleep can affect your health.  Define sleep hygiene, discuss sleep cycles and impact of sleep habits. Review good sleep hygiene tips.    Other: -Provides group and verbal instruction on various topics (see comments)    Knowledge Questionnaire Score: Knowledge Questionnaire Score - 03/22/19 0906      Knowledge Questionnaire Score   Pre Score  16/18 Education Focus: Oxygen Safety        Core Components/Risk Factors/Patient Goals at Admission: Personal Goals and Risk Factors  at Admission - 03/22/19 0906      Core Components/Risk Factors/Patient Goals on Admission    Weight Management  Yes;Obesity;Weight Loss    Intervention  Weight Management: Develop a combined nutrition and exercise program designed to reach desired caloric intake, while maintaining appropriate intake of nutrient and fiber, sodium and fats, and appropriate energy expenditure required for the weight goal.;Weight Management: Provide education and appropriate resources to  help participant work on and attain dietary goals.;Weight Management/Obesity: Establish reasonable short term and long term weight goals.;Obesity: Provide education and appropriate resources to help participant work on and attain dietary goals.    Admit Weight  230 lb (104.3 kg)    Goal Weight: Short Term  225 lb (102.1 kg)    Goal Weight: Long Term  220 lb (99.8 kg)    Expected Outcomes  Short Term: Continue to assess and modify interventions until short term weight is achieved;Long Term: Adherence to nutrition and physical activity/exercise program aimed toward attainment of established weight goal;Weight Loss: Understanding of general recommendations for a balanced deficit meal plan, which promotes 1-2 lb weight loss per week and includes a negative energy balance of [phone] kcal/d;Understanding recommendations for meals to include 15-35% energy as protein, 25-35% energy from fat, 35-60% energy from carbohydrates, less than '200mg'$  of dietary cholesterol, 20-35 gm of total fiber daily;Understanding of distribution of calorie intake throughout the day with the consumption of 4-5 meals/snacks    Tobacco Cessation  Yes    Intervention  Offer self-teaching materials, assist with locating and accessing local/national Quit Smoking programs, and support quit date choice.    Expected Outcomes  Short Term: Will quit all tobacco product use, adhering to prevention of relapse plan.;Long Term: Complete abstinence from all tobacco products for at least  12 months from quit date.    Improve shortness of breath with ADL's  Yes    Intervention  Provide education, individualized exercise plan and daily activity instruction to help decrease symptoms of SOB with activities of daily living.    Expected Outcomes  Short Term: Improve cardiorespiratory fitness to achieve a reduction of symptoms when performing ADLs;Long Term: Be able to perform more ADLs without symptoms or delay the onset of symptoms    Heart Failure  Yes    Intervention  Provide a combined exercise and nutrition program that is supplemented with education, support and counseling about heart failure. Directed toward relieving symptoms such as shortness of breath, decreased exercise tolerance, and extremity edema.    Expected Outcomes  Improve functional capacity of life;Short term: Attendance in program 2-3 days a week with increased exercise capacity. Reported lower sodium intake. Reported increased fruit and vegetable intake. Reports medication compliance.;Short term: Daily weights obtained and reported for increase. Utilizing diuretic protocols set by physician.;Long term: Adoption of self-care skills and reduction of barriers for early signs and symptoms recognition and intervention leading to self-care maintenance.       Core Components/Risk Factors/Patient Goals Review:  Goals and Risk Factor Review    Row Name 04/17/19 1155             Core Components/Risk Factors/Patient Goals Review   Personal Goals Review  Weight Management/Obesity;Tobacco Cessation;Improve shortness of breath with ADL's;Hypertension       Review  Her weight was up recently, but she is trying to get it back down.  She has already started to take steps to improve it again and keep moving.  She is still doing well staying away from cigarettes.  Breathing is improving. and Pressures have been good.       Expected Outcomes  Short: Work on weight loss.  Long: Continue monitor risk factors.          Core  Components/Risk Factors/Patient Goals at Discharge (Final Review):  Goals and Risk Factor Review - 04/17/19 1155      Core Components/Risk Factors/Patient Goals Review   Personal Goals Review  Weight Management/Obesity;Tobacco  Cessation;Improve shortness of breath with ADL's;Hypertension    Review  Her weight was up recently, but she is trying to get it back down.  She has already started to take steps to improve it again and keep moving.  She is still doing well staying away from cigarettes.  Breathing is improving. and Pressures have been good.    Expected Outcomes  Short: Work on weight loss.  Long: Continue monitor risk factors.       ITP Comments: ITP Comments    Row Name 03/21/19 1140 03/22/19 0857 03/22/19 0858 03/29/19 0818 04/04/19 1429   ITP Comments  Completed virtual orientation today.  EP eval scheduled for 1/14 at 8am.  Documentation for diagnosis can be in [facility] encounter 01/03/19 and 02/28/19.  Completed 6MWT and gym orientation.  Initial ITP created and sent for review to Dr. Emily Filbert, Medical Director.  Jameica has recently quit tobacco use within the last 6 months. Intervention for relapse prevention was provided at the initial medical review. She was encouraged to continue to with tobacco cessation and was provided information on relapse prevention. Patient received information about combination therapy, tobacco cessation classes, quit line, and quit smoking apps in case of a relapse. Patient demonstrated understanding of this material.Staff will continue to provide encouragement and follow up with the patient throughout the program.  First full day of exercise!  Patient was oriented to gym and equipment including functions, settings, policies, and procedures.  Patient's individual exercise prescription and treatment plan were reviewed.  All starting workloads were established based on the results of the 6 minute walk test done at initial orientation visit.  The plan for exercise  progression was also introduced and progression will be customized based on patient's performance and goals..  30 day review completed. ITP sent to Dr. Emily Filbert, Medical Director of Cardiac and Pulmonary Rehab. Continue with ITP unless changes are made by physician.  Department operating under reduced schedule until further notice by request from hospital leadership.  New to program oriented 03/22/19.   [place] Name 04/05/19 1522 04/16/19 0842 05/02/19 0643       ITP Comments  Completed Initial RD Eval  PHQ 9 repeated  Score decreased 17 to 7 today.  30 day chart review completed. ITP sent to Dr Zachery Dakins Medical Director, for review,changes as needed and signature.        Comments:

## 2019-05-03 ENCOUNTER — Other Ambulatory Visit: Payer: Self-pay

## 2019-05-03 ENCOUNTER — Encounter: Payer: Medicaid Other | Admitting: *Deleted

## 2019-05-03 DIAGNOSIS — J449 Chronic obstructive pulmonary disease, unspecified: Secondary | ICD-10-CM | POA: Diagnosis not present

## 2019-05-03 NOTE — Progress Notes (Signed)
Daily Session Note  Patient Details  Name: Terri Hughes MRN: [id number] Date of Birth: 1960/10/27 Referring Provider:     Pulmonary Rehab from 03/22/2019 in [facility] Cardiac and Pulmonary Rehab  Referring Provider  Thad Ranger MD      Encounter Date: 05/03/2019  Check In: Session Check In - 05/03/19 1126      Check-In   Supervising physician immediately available to respond to emergencies  See telemetry face sheet for immediately available ER MD    Location  [facility]-Cardiac & Pulmonary Rehab    Staff Present  Renita Papa, RN BSN;Joseph Hood RCP,RRT,BSRT;Melissa Clyde RDN, Rowe Pavy, BA, ACSM CEP, Exercise Physiologist    Virtual Visit  No    Medication changes reported      No    Fall or balance concerns reported     No    Warm-up and Cool-down  Performed on first and last piece of equipment    Resistance Training Performed  Yes    VAD Patient?  No    PAD/SET Patient?  No      Pain Assessment   Currently in Pain?  No/denies          Social History   Tobacco Use  Smoking Status Former Smoker  . Packs/day: 3.00  . Years: 47.00  . Pack years: 141.00  . Types: Cigarettes  . Start date: 04/18/1971  . Quit date: 01/02/2019  . Years since quitting: 0.3  Smokeless Tobacco Never Used    Goals Met:  Independence with exercise equipment Exercise tolerated well No report of cardiac concerns or symptoms Strength training completed today  Goals Unmet:  Not Applicable  Comments: Pt able to follow exercise prescription today without complaint.  Will continue to monitor for progression.    Dr. Emily Filbert is Medical Director for [place] and [facility].

## 2019-05-08 ENCOUNTER — Other Ambulatory Visit: Payer: Self-pay

## 2019-05-08 ENCOUNTER — Encounter: Payer: Medicaid Other | Attending: Pulmonary Disease | Admitting: *Deleted

## 2019-05-08 DIAGNOSIS — J449 Chronic obstructive pulmonary disease, unspecified: Secondary | ICD-10-CM | POA: Diagnosis present

## 2019-05-08 NOTE — Progress Notes (Signed)
Daily Session Note  Patient Details  Name: BRIANNE MAINA MRN: [id number] Date of Birth: 24-Oct-1960 Referring Provider:     Pulmonary Rehab from 03/22/2019 in [facility] Cardiac and Pulmonary Rehab  Referring Provider  Thad Ranger MD      Encounter Date: 05/08/2019  Check In: Session Check In - 05/08/19 1213      Check-In   Supervising physician immediately available to respond to emergencies  See telemetry face sheet for immediately available ER MD    Location  [facility]-Cardiac & Pulmonary Rehab    Staff Present  Heath Lark, RN, BSN, CCRP;Jessica Pierson, MA, RCEP, CCRP, CCET;Joseph Owen RCP,RRT,BSRT    Virtual Visit  No    Medication changes reported      No    Fall or balance concerns reported     No    Warm-up and Cool-down  Performed on first and last piece of equipment    Resistance Training Performed  Yes    VAD Patient?  No    PAD/SET Patient?  No      Pain Assessment   Currently in Pain?  No/denies          Social History   Tobacco Use  Smoking Status Former Smoker  . Packs/day: 3.00  . Years: 47.00  . Pack years: 141.00  . Types: Cigarettes  . Start date: 04/18/1971  . Quit date: 01/02/2019  . Years since quitting: 0.3  Smokeless Tobacco Never Used    Goals Met:  Proper associated with RPD/PD & O2 Sat Independence with exercise equipment Exercise tolerated well No report of cardiac concerns or symptoms  Goals Unmet:  Not Applicable  Comments: Pt able to follow exercise prescription today without complaint.  Will continue to monitor for progression.    Dr. Emily Filbert is Medical Director for [place] and [facility].

## 2019-05-10 ENCOUNTER — Ambulatory Visit: Payer: Medicaid Other

## 2019-05-15 ENCOUNTER — Ambulatory Visit: Payer: Medicaid Other | Attending: Obstetrics & Gynecology

## 2019-05-15 ENCOUNTER — Other Ambulatory Visit: Payer: Self-pay

## 2019-05-15 ENCOUNTER — Encounter: Payer: Medicaid Other | Admitting: *Deleted

## 2019-05-15 DIAGNOSIS — R278 Other lack of coordination: Secondary | ICD-10-CM | POA: Diagnosis present

## 2019-05-15 DIAGNOSIS — J449 Chronic obstructive pulmonary disease, unspecified: Secondary | ICD-10-CM | POA: Diagnosis not present

## 2019-05-15 DIAGNOSIS — M4125 Other idiopathic scoliosis, thoracolumbar region: Secondary | ICD-10-CM | POA: Diagnosis not present

## 2019-05-15 DIAGNOSIS — M533 Sacrococcygeal disorders, not elsewhere classified: Secondary | ICD-10-CM | POA: Insufficient documentation

## 2019-05-15 DIAGNOSIS — M62838 Other muscle spasm: Secondary | ICD-10-CM | POA: Diagnosis present

## 2019-05-15 NOTE — Therapy (Signed)
[place] MAIN [facility] SERVICES [address] [place], [place], [address] Phone: [phone]   Fax:  [phone]  Physical Therapy Treatment  The patient has been informed of current processes in place at Outpatient Rehab to protect patients from Covid-19 exposure including social distancing, schedule modifications, and new cleaning procedures. After discussing their particular risk with a therapist based on the patient's personal risk factors, the patient has decided to proceed with in-person therapy.   Patient Details  Name: Terri Hughes MRN: [id number] Date of Birth: [birth year] No data recorded  Encounter Date: 05/15/2019  PT End of Session - 05/15/19 0935    Visit Number  3    Number of Visits  10    Date for PT Re-Evaluation  06/27/19    Authorization Type  Mcaid    Authorization - Visit Number  3    Authorization - Number of Visits  4    Progress Note Due on Visit  10    PT Start Time  0930    PT Stop Time  1025    PT Time Calculation (min)  55 min    Activity Tolerance  Patient tolerated treatment well;No increased pain    Behavior During Therapy  WFL for tasks assessed/performed       Past Medical History:  Diagnosis Date  . Asthma   . CHF (congestive heart failure) ([place])     Past Surgical History:  Procedure Laterality Date  . ABDOMINAL HYSTERECTOMY    . APPENDECTOMY    . CESAREAN SECTION      There were no vitals filed for this visit.    Pelvic Floor Physical Therapy Treatment Note  SCREENING  Changes in medications, allergies, or medical history?: none    SUBJECTIVE  Patient reports: Has noticed that her back does not hurt when she is doing dishes and cooking anymore when she wears her heel-lift. Had a fall last week on the treadmill, hurt her R arm.   Precautions:  COPD, Asthma, obesity, Hx. Of Hysterectomy and oophrectomy. Bladder sling. Appendectomy  Sexual activity/pain: Hx. Of pain with  intercourse   Location of pain:lower abdomen, pelvic Current pain: 0/10  Max pain: 0/10 Least pain: 0/10 Nature of pain:spasms  Location of pain: LBP Current pain: 3/10  Max pain: 3/10 Least pain: 0/10 Nature of pain:constant, sharp and dull  **no pain following treatment.  Patient Goals: Be able to get out of the house to get groceries or socialize without having an accident.    OBJECTIVE  Changes in: Posture/Observations:  Hyperlordotic  With squat, Pt. Using knees and back to return to stand, able to put weight on heels fr sustained squat/stretch.  Range of Motion/Flexibilty:    Strength/MMT:  LE MMT:  Pelvic floor:  Abdominal:  Pt. Able to coordinate breathing, TA, and PFM contraction with MIN verbal and tactile cueing, independently at end of session with ~ 75% accuracy.   Palpation:  Gait Analysis:  INTERVENTIONS THIS SESSION:  Therex: Reviewed posterior pelvic tilts in supine with a pillow under the hips to decrease POP Sx. And educated on and practiced squat form, decrearsing depth to 50% to improve recruitment of glutes/form to improve strength of muscles opposing tight musculature to allow reciprocal inhibition to improve balance of musculature surrounding the pelvis and improve overall posture for optimal musculature length-tension relationship and function and to maintain and improve muscle length and allow for improved balance of musculature for long-term symptom relief.  Self-care: Reviewed how  to perform supine-to -sit transfer and educated on and practiced sit-to-stand and stand-to-sit transfer with exhale as well as educated on soda can theory  to decrease POP Sx. Educated on and used teach-back method on Urge-suppression technique to decrease UUI. Educated on [employer], where to purchase to decrease straining with BM's and decrease pressure on POP to decrease incontinence.  Total time: 55  min.                            PT Short Term Goals - 05/15/19 0355      PT SHORT TERM GOAL #1   Title  Patient will demonstrate improved pelvic alignment and balance of musculature surrounding the pelvis to facilitate decreased PFM spasms and decrease pelvic pain.    Baseline  RLE long, spasms surrounding pelvis and LB. As of 3/9: some remaining spasms surrounding the pelvis but decreased pain and improved pelvic alignment.    Time  5    Period  Weeks    Status  Partially Met    Target Date  05/23/19      PT SHORT TERM GOAL #2   Title  Patient will demonstrate functional recruitment of TA with breathing, sit-to-stand, squatting/lifting, and walking to allow for improved pelvic brace coordination, improved balance, and decreased downward pressure on the pelvic organs.    Baseline  Pt. having leakage with transitions and coughing, pelvic pain with standing still    Time  5    Period  Weeks    Status  Achieved    Target Date  05/23/19      PT SHORT TERM GOAL #3   Title  Patient will report consistent use of foot-stool (squatty-potty) for positioning with BM to decrease pain with BM and intra-abdominal pressure.    Baseline  Has IBS, straining to have a BM, currently would be constipated but staying regular with medication management. As of 3/9: Pt. understands concept and plans to purchase and use Squatty potty.    Time  5    Period  Weeks    Status  Partially Met    Target Date  05/23/19      PT SHORT TERM GOAL #4   Title  Pt. will be able to successfully implement urge-suppression tecnique within the home and pre-squeeze-and sneeze technique to decrease SUI and begin improving coordination/timing of the PFM    Baseline  Pt. has leakage with cough every time, keeps a towel on her chair to catch leakage, running to 22 steps to her toilet when she has an urge.    Time  5    Period  Weeks    Status  Achieved    Target Date  05/23/19      PT SHORT TERM GOAL #5    Title  Patient will report a reduction in pain to no greater than 6/10 over the prior week to demonstrate symptom improvement.    Baseline  Pain reaches 10/10 in the back with standing still or activity, and 8/10 in the pelvis with straining.    Time  5    Period  Weeks    Status  Achieved    Target Date  05/23/19        PT Long Term Goals - 05/15/19 1047      PT LONG TERM GOAL #1   Title  Patient will report no episodes of SUI/UUI of FI over the course of the prior two weeks to  demonstrate improved functional ability.    Baseline  Pt. not leaving her house due to incontinence other than for doctor's appointments/therapy.    Time  10    Period  Weeks    Status  On-going    Target Date  06/27/19      PT LONG TERM GOAL #2   Title  Patient will describe feeling of pressure no more than 10% of the time over the course of the past week to demonstrate improved recruitment and strength of the pelvic floor.    Baseline  Pt describes "feeling like she is sitting on a corn-cob" all the time.    Time  10    Period  Weeks    Status  On-going    Target Date  06/27/19      PT LONG TERM GOAL #3   Title  Pt. will improve in FOTO scores by at least 15 points to demonstrate improved function.    Baseline  Urinary: 42 Bowel: 41 Cnst: 47    Time  10    Period  Weeks    Status  On-going    Target Date  06/27/19      PT LONG TERM GOAL #4   Title  Patient will report having BM's at least every-other day with consistency between [facility] stool scale 3-5 over the prior week to demonstrate decreased constipation.    Baseline  IBS, currently more constipated, went 3 weeks without BM and did not realize.    Time  10    Period  Weeks    Status  On-going    Target Date  06/27/19      PT LONG TERM GOAL #5   Title  Pt. will report feeling confident enough to go grocery shopping and out to a friend/family members home for improved social contact, improved function, and increased QOL.    Baseline  Pt.  stopped leaving the home because of fear of having an accident.    Time  10    Period  Weeks    Status  On-going    Target Date  06/27/19      PT LONG TERM GOAL #6   Title  Patient will describe pain no greater than 3/10 during and after 30+min. of exercise, cooking, shopping, standing etc.  to demonstrate improved functional ability.    Baseline  Pt. has pain that becomes unbearable with standing >15 min. in the low back and pain in the pelvis with "straining"    Time  10    Period  Weeks    Status  On-going    Target Date  06/27/19            Plan - 05/15/19 1035    Clinical Impression Statement  Pt. Responded well to all interventions today, demonstrating improved squat form, coordination of the muscles surrounding the pelvis, deep core recruitment, as well as understanding and correct performance of all education and exercises provided today. She has met her short-term goals except for some remaining muscular imbalance surrounding the pelvis and is on-track to continue to improve toward her long-term goals.They will continue to benefit from skilled physical therapy to work toward remaining goals and maximize function as well as decrease likelihood of symptom increase or recurrence.     PT Next Visit Plan  Spasm reduction/gently sacral mobs, hip-flexor stretch, standing and seated posture, squat technique.    PT Home Exercise Plan  Diaphragmatic breathing, side-stretch, heel-lift, Posterior pelvic tilts in hook-lying with  and without pillow, low back stretch, seated pelvic tilts, urge suppression technique, squatty potty, soda can theory, sit-to-stand, and squat form.    Consulted and Agree with Plan of Care  Patient       Patient will benefit from skilled therapeutic intervention in order to improve the following deficits and impairments:     Visit Diagnosis: Other idiopathic scoliosis, thoracolumbar region  Sacrococcygeal disorders, not elsewhere classified  Other muscle  spasm  Other lack of coordination     Problem List Patient Active Problem List   Diagnosis Date Noted  . 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 Cessation of tobacco use in previous 12 months 02/20/2019  . Seasonal allergies 02/20/2019  . Hypoxia   . SOB (shortness of breath)   . COPD with acute exacerbation ([place])   . Acute on chronic respiratory failure with hypoxemia ([place]) 01/04/2019  . Chronic bronchitis ([place]) 05/08/2018  . Anxiety 05/08/2018  . Depression 05/08/2018  . Compressed cervical disc 05/08/2018  . Carpal tunnel syndrome on both sides 05/08/2018  . Chronic sinusitis 05/08/2018  . IBS (irritable bowel syndrome) 05/08/2018   Willa Rough DPT, ATC Willa Rough 05/15/2019, 10:48 AM  [place] MAIN [facility] SERVICES [address] [place], [place], [address] Phone: [phone]   Fax:  [phone]  Name: Terri Hughes MRN: [id number] Date of Birth: [birth year]

## 2019-05-15 NOTE — Progress Notes (Signed)
Daily Session Note  Patient Details  Name: Terri Hughes MRN: [id number] Date of Birth: 1960/09/22 Referring Provider:     Pulmonary Rehab from 03/22/2019 in [facility] Cardiac and Pulmonary Rehab  Referring Provider  Thad Ranger MD      Encounter Date: 05/15/2019  Check In: Session Check In - 05/15/19 1120      Check-In   Supervising physician immediately available to respond to emergencies  See telemetry face sheet for immediately available ER MD    Location  [facility]-Cardiac & Pulmonary Rehab    Staff Present  Heath Lark, RN, BSN, CCRP;Amanda Sommer, BA, ACSM CEP, Exercise Physiologist;Joseph Hood RCP,RRT,BSRT    Virtual Visit  No    Medication changes reported      No    Fall or balance concerns reported     No    Warm-up and Cool-down  Performed on first and last piece of equipment    Resistance Training Performed  Yes    VAD Patient?  No    PAD/SET Patient?  No      Pain Assessment   Currently in Pain?  No/denies          Social History   Tobacco Use  Smoking Status Former Smoker  . Packs/day: 3.00  . Years: 47.00  . Pack years: 141.00  . Types: Cigarettes  . Start date: 04/18/1971  . Quit date: 01/02/2019  . Years since quitting: 0.3  Smokeless Tobacco Never Used    Goals Met:  Proper associated with RPD/PD & O2 Sat Independence with exercise equipment Exercise tolerated well No report of cardiac concerns or symptoms  Goals Unmet:  Not Applicable  Comments: Pt able to follow exercise prescription today without complaint.  Will continue to monitor for progression.    Dr. Emily Filbert is Medical Director for [facility] and [facility].

## 2019-05-15 NOTE — Patient Instructions (Signed)
   Urge supression technique:  1) Take a deep breath to convince yourself that you are in control and calm the nervous system.  2) Do 5 "quick-flick" kegels (pelvic floor muscle contractions) and re-assess the urge. Repeat another set if urge is still present. 3) Once the urge has decreased, start walking calmly to the the bathroom. Stop and repeat steps 1 and 2 as many times as needed until you can successfully get to the toilet. 4) Only once seated, take a deep breath and allow the pelvic floor muscles to relax and allow for the urine to flow.    Do not be discouraged if you are not successful the first couple times, this is normal and it will take practice but remember that YOU are in control. Start by practicing this at home where you do not have to worry as much if there were to be an accident. Allowing yourself to get rushed or nervous puts the bladder back in control and will not allow the technique to work.   Pelvic Tilt With Pelvic Floor (Hook-Lying)        Lie with hips and knees bent. Squeeze pelvic floor and flatten low back while breathing out so that pelvis tilts. Repeat _2x10__ times. Do _1-2__ times a day (or as needed for "bad days").  Put a pillow under your hips in this position when doing this exercise to help decrease pressure in the pelvis when it feels "heavy".  Getting In/out of bed     Lying on back, bend left knee and place left arm across chest. Roll all in one movement to the right. Reverse to roll to the left. Always move as one unit.     Once you are lying on you side, move legs to edge of bed. Pull in the pelvic floor and lower tummy and push down with both hands while moving legs off bed to reach sitting position.  *Reverse sequence to return to lying down.    EXhale on EXertion!!!!! (pushing, pulling, lifting, standing, etc.)  Whenever you exert force you need to exhale to allow the pressure to escape out of your vocal cords rather than be pressed  down through your pelvic floor. Exhaling also helps engage your deep-core muscles to protect your back and pelvic floor.    Sit, feet flat, scoot forward to the edge of the chair. Inhale as you bend forward at hips, begin to exhale just before and while you stand, contracting the glutes, lower tummy muscles and pelvic floor as if stopping urination as you stand up.   * Do this every time you sit or stand! If you catch yourself doing it "wrong, re-set and do it again so it can become habit!

## 2019-05-22 ENCOUNTER — Ambulatory Visit: Payer: Medicaid Other

## 2019-05-24 ENCOUNTER — Other Ambulatory Visit: Payer: Self-pay

## 2019-05-24 ENCOUNTER — Encounter: Payer: Medicaid Other | Admitting: *Deleted

## 2019-05-24 ENCOUNTER — Ambulatory Visit: Payer: Medicaid Other

## 2019-05-24 DIAGNOSIS — M4125 Other idiopathic scoliosis, thoracolumbar region: Secondary | ICD-10-CM | POA: Diagnosis not present

## 2019-05-24 DIAGNOSIS — M62838 Other muscle spasm: Secondary | ICD-10-CM

## 2019-05-24 DIAGNOSIS — M533 Sacrococcygeal disorders, not elsewhere classified: Secondary | ICD-10-CM

## 2019-05-24 DIAGNOSIS — R278 Other lack of coordination: Secondary | ICD-10-CM

## 2019-05-24 DIAGNOSIS — J449 Chronic obstructive pulmonary disease, unspecified: Secondary | ICD-10-CM

## 2019-05-24 NOTE — Patient Instructions (Signed)
Seated -in chair hamstring stretch    Perform 2 sets of 5 belly breaths to both sides, a third set on the right side. Repeat 1-2/ day   Action: Stick on leg out, with toes pointing up towards your nose and knee straight. With abdominals engaged, sitting up right, gently lean forward till you feel a gentle stretch at the back of your leg.       **use a towel or sheet to help grip thigh, keep the back flat   Bring both knees up to your chest and then hold the one farthest from the edge of the table/bed and let the other relax toward the floor until stretch is felt through the front of the hip.   Hold-relax: Gently lift the knee of the down leg up ~ 1/2 and inch and hold for 5 seconds, then let it relax all the way for a second and repeat 4 more times to decrease resting tension in the muscle.   Stretch: Let the leg relax and feel a stretch across the front of the hip/thigh as you take belly breaths. Hold for __5__ deep belly breaths. Relax. Repeat __2-3__ times per side.    Do this __1-2__ times per day.

## 2019-05-24 NOTE — Progress Notes (Signed)
Daily Session Note  Patient Details  Name: Terri Hughes MRN: [id number] Date of Birth: Dec 27, 1960 Referring Provider:     Pulmonary Rehab from 03/22/2019 in [facility] [place] [facility] Cardiac and Pulmonary Rehab  Referring Provider  Thad Ranger MD      Encounter Date: 05/24/2019  Check In: Session Check In - 05/24/19 1153      Check-In   Supervising physician immediately available to respond to emergencies  See telemetry face sheet for immediately available ER MD    Location  [facility]-Cardiac & Pulmonary Rehab    Staff Present  Renita Papa, RN Vickki Hearing, BA, ACSM CEP, Exercise Physiologist;Mary Kellie Shropshire, RN, BSN, [place], BS, RRT, CPFT    Virtual Visit  No    Medication changes reported      No    Fall or balance concerns reported     No    Warm-up and Cool-down  Performed on first and last piece of equipment    Resistance Training Performed  Yes    VAD Patient?  No    PAD/SET Patient?  No      Pain Assessment   Currently in Pain?  No/denies          Social History   Tobacco Use  Smoking Status Former Smoker  . Packs/day: 3.00  . Years: 47.00  . Pack years: 141.00  . Types: Cigarettes  . Start date: 04/18/1971  . Quit date: 01/02/2019  . Years since quitting: 0.3  Smokeless Tobacco Never Used    Goals Met:  Independence with exercise equipment Exercise tolerated well No report of cardiac concerns or symptoms Strength training completed today  Goals Unmet:  Not Applicable  Comments: Pt able to follow exercise prescription today without complaint.  Will continue to monitor for progression.    Dr. Emily Filbert is Medical Director for [facility] [place] and [facility].

## 2019-05-24 NOTE — Therapy (Signed)
[place] [facility] SERVICES [address] [place], [place], [address] Phone: [phone]   Fax:  [phone]  Physical Therapy Treatment  The patient has been informed of current processes in place at [facility]patient [facility] to protect patients from Covid-19 exposure including social distancing, schedule modifications, and new cleaning procedures. After discussing their particular risk with a therapist based on the patient's personal risk factors, the patient has decided to proceed with in-person therapy.   Patient Details  Name: Terri Hughes MRN: [id number] Date of Birth: 1960-11-13 No data recorded  Encounter Date: 05/24/2019  PT End of Session - 05/24/19 1512    Visit Number  4    Number of Visits  15    Date for PT Re-Evaluation  08/13/19    Authorization Type  Mcaid    Authorization Time Period  3/16 through 6/7 12 visits    Authorization - Visit Number  1    Authorization - Number of Visits  12    Progress Note Due on Visit  10    PT Start Time  1000    PT Stop Time  1100    PT Time Calculation (min)  60 min    Activity Tolerance  Patient tolerated treatment well;No increased pain    Behavior During Therapy  WFL for tasks assessed/performed       Past Medical History:  Diagnosis Date  . Asthma   . CHF (congestive heart failure) ([place])     Past Surgical History:  Procedure Laterality Date  . ABDOMINAL HYSTERECTOMY    . APPENDECTOMY    . CESAREAN SECTION      There were no vitals filed for this visit.   Pelvic Floor Physical Therapy Treatment Note  SCREENING  Changes in medications, allergies, or medical history?: none    SUBJECTIVE  Patient reports: She is still having pain in her R hand in the pinky and sometimes the ring-finger. Her LBP has been a little but higher again especially when walking, doing dishes, etc.   Precautions:  COPD, Asthma, obesity, Hx. Of Hysterectomy and oophrectomy. Bladder sling.  Appendectomy  Sexual activity/pain: Hx. Of pain with intercourse   Location of pain:lower abdomen, pelvic Current pain: 0/10  Max pain: 0/10 Least pain: 0/10 Nature of pain:spasms  Location of pain: LBP Current pain: 3/10  Max pain: 3/10 Least pain: 0/10 Nature of pain:constant, sharp and dull  **no pain following treatment.  Patient Goals: Be able to get out of the house to get groceries or socialize without having an accident.    OBJECTIVE  Changes in: Posture/Observations:  Hyperlordotic  R knee bent, L PSIS slightly high, L apears high but difficult to tell due to body habitus.  Range of Motion/Flexibilty:    Strength/MMT:  LE MMT:  Pelvic floor:  Abdominal:    Palpation: TTP and swelling/slight deformity at R 5th metacarpal.  Gait Analysis:  INTERVENTIONS THIS SESSION:  Therex: Performed TP release to R Psoas and Pectineus to decrease spasm and pain and allow for improved balance of musculature for improved function and decreased symptoms.  Therex: educated on hamstring stretch and hip-flexor hold-release stretch in supine at EOB.   Self-care: created a makeshift splint for R pinky with a tongue depressor and k-tape to allow for faster healing since PT. Stopped wearing splint ue to decreased dexterity.  Total time: 60 min.  PT Short Term Goals - 05/15/19 2778      PT SHORT TERM GOAL #1   Title  Patient will demonstrate improved pelvic alignment and balance of musculature surrounding the pelvis to facilitate decreased PFM spasms and decrease pelvic pain.    Baseline  RLE long, spasms surrounding pelvis and LB. As of 3/9: some remaining spasms surrounding the pelvis but decreased pain and improved pelvic alignment.    Time  5    Period  Weeks    Status  Partially Met    Target Date  05/23/19      PT SHORT TERM GOAL #2   Title  Patient will demonstrate functional recruitment of TA with  breathing, sit-to-stand, squatting/lifting, and walking to allow for improved pelvic brace coordination, improved balance, and decreased downward pressure on the pelvic organs.    Baseline  Pt. having leakage with transitions and coughing, pelvic pain with standing still    Time  5    Period  Weeks    Status  Achieved    Target Date  05/23/19      PT SHORT TERM GOAL #3   Title  Patient will report consistent use of foot-stool (squatty-potty) for positioning with BM to decrease pain with BM and intra-abdominal pressure.    Baseline  Has IBS, straining to have a BM, currently would be constipated but staying regular with medication management. As of 3/9: Pt. understands concept and plans to purchase and use Squatty potty.    Time  5    Period  Weeks    Status  Partially Met    Target Date  05/23/19      PT SHORT TERM GOAL #4   Title  Pt. will be able to successfully implement urge-suppression tecnique within the home and pre-squeeze-and sneeze technique to decrease SUI and begin improving coordination/timing of the PFM    Baseline  Pt. has leakage with cough every time, keeps a towel on her chair to catch leakage, running to 22 steps to her toilet when she has an urge.    Time  5    Period  Weeks    Status  Achieved    Target Date  05/23/19      PT SHORT TERM GOAL #5   Title  Patient will report a reduction in pain to no greater than 6/10 over the prior week to demonstrate symptom improvement.    Baseline  Pain reaches 10/10 in the back with standing still or activity, and 8/10 in the pelvis with straining.    Time  5    Period  Weeks    Status  Achieved    Target Date  05/23/19        PT Long Term Goals - 05/15/19 1047      PT LONG TERM GOAL #1   Title  Patient will report no episodes of SUI/UUI of FI over the course of the prior two weeks to demonstrate improved functional ability.    Baseline  Pt. not leaving her house due to incontinence other than for doctor's  appointments/therapy.    Time  10    Period  Weeks    Status  On-going    Target Date  06/27/19      PT LONG TERM GOAL #2   Title  Patient will describe feeling of pressure no more than 10% of the time over the course of the past week to demonstrate improved recruitment and strength of the pelvic floor.  Baseline  Pt describes "feeling like she is sitting on a corn-cob" all the time.    Time  10    Period  Weeks    Status  On-going    Target Date  06/27/19      PT LONG TERM GOAL #3   Title  Pt. will improve in FOTO scores by at least 15 points to demonstrate improved function.    Baseline  Urinary: 42 Bowel: 41 Cnst: 47    Time  10    Period  Weeks    Status  On-going    Target Date  06/27/19      PT LONG TERM GOAL #4   Title  Patient will report having BM's at least every-other day with consistency between [facility] stool scale 3-5 over the prior week to demonstrate decreased constipation.    Baseline  IBS, currently more constipated, went 3 weeks without BM and did not realize.    Time  10    Period  Weeks    Status  On-going    Target Date  06/27/19      PT LONG TERM GOAL #5   Title  Pt. will report feeling confident enough to go grocery shopping and out to a friend/family members home for improved social contact, improved function, and increased QOL.    Baseline  Pt. stopped leaving the home because of fear of having an accident.    Time  10    Period  Weeks    Status  On-going    Target Date  06/27/19      PT LONG TERM GOAL #6   Title  Patient will describe pain no greater than 3/10 during and after 30+min. of exercise, cooking, shopping, standing etc.  to demonstrate improved functional ability.    Baseline  Pt. has pain that becomes unbearable with standing >15 min. in the low back and pain in the pelvis with "straining"    Time  10    Period  Weeks    Status  On-going    Target Date  06/27/19            Plan - 05/24/19 1514    Clinical Impression  Statement  Pt. Responded well to all interventions today, demonstrating improved hip EXT on R, decreased spasm and resolution of LBP as well as understanding and correct performance of all education and exercises provided today. They will continue to benefit from skilled physical therapy to work toward remaining goals and maximize function as well as decrease likelihood of symptom increase or recurrence.     PT Next Visit Plan  Spasm reduction to R hamstrings,/gentle sacral mobs, standing and seated posture, review squat technique.    PT Home Exercise Plan  Diaphragmatic breathing, side-stretch, heel-lift, Posterior pelvic tilts in hook-lying with and without pillow, low back stretch, seated pelvic tilts, urge suppression technique, squatty potty, soda can theory, sit-to-stand, and squat form, hip-flexor stretch/hold-relax, seated hamstring stretch.    Consulted and Agree with Plan of Care  Patient       Patient will benefit from skilled therapeutic intervention in order to improve the following deficits and impairments:     Visit Diagnosis: Other idiopathic scoliosis, thoracolumbar region  Sacrococcygeal disorders, not elsewhere classified  Other muscle spasm  Other lack of coordination     Problem List Patient Active Problem List   Diagnosis Date Noted  . 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 Cessation of tobacco use in previous 12 months 02/20/2019  .  Seasonal allergies 02/20/2019  . Hypoxia   . SOB (shortness of breath)   . COPD with acute exacerbation ([place])   . Acute on chronic respiratory failure with hypoxemia ([place]) 01/04/2019  . Chronic bronchitis ([place]) 05/08/2018  . Anxiety 05/08/2018  . Depression 05/08/2018  . Compressed cervical disc 05/08/2018  . Carpal tunnel syndrome on both sides 05/08/2018  . Chronic sinusitis 05/08/2018  . IBS (irritable bowel syndrome) 05/08/2018   Willa Rough DPT, ATC Willa Rough 05/24/2019, 3:21 PM  [place] [facility] MAIN [facility] SERVICES [address] [place], [place], [address] Phone: [phone]   Fax:  [phone]  Name: RONIYA TETRO MRN: [id number] Date of Birth: Aug 20, 1960

## 2019-05-25 ENCOUNTER — Ambulatory Visit: Payer: Medicaid Other | Attending: Internal Medicine

## 2019-05-25 DIAGNOSIS — Z23 Encounter for immunization: Secondary | ICD-10-CM

## 2019-05-25 NOTE — Progress Notes (Signed)
   Covid-19 Vaccination Clinic  Name:  Terri Hughes    MRN: [id number] DOB: 1960-05-12  05/25/2019  Terri Hughes was observed post Covid-19 immunization for 15 minutes without incident. She was provided with Vaccine Information Sheet and instruction to access the V-Safe system.   Terri Hughes was instructed to call 911 with any severe reactions post vaccine: Marland Kitchen Difficulty breathing  . Swelling of face and throat  . A fast heartbeat  . A bad rash all over body  . Dizziness and weakness   Immunizations Administered    Name Date Dose VIS Date Route   Pfizer COVID-19 Vaccine 05/25/2019  9:57 AM 0.3 mL 02/16/2019 Intramuscular   Manufacturer: [place]   Lot: [id number]   [place]: [id number]

## 2019-05-30 ENCOUNTER — Encounter: Payer: Self-pay | Admitting: *Deleted

## 2019-05-30 DIAGNOSIS — J449 Chronic obstructive pulmonary disease, unspecified: Secondary | ICD-10-CM

## 2019-05-30 NOTE — Progress Notes (Signed)
Pulmonary Individual Treatment Plan  Patient Details  Name: MELIKA REDER MRN: [id number] Date of Birth: 13-May-1960 Referring Provider:     Pulmonary Rehab from 03/22/2019 in [facility] Cardiac and Pulmonary Rehab  Referring Provider  Thad Ranger MD      Initial Encounter Date:    Pulmonary Rehab from 03/22/2019 in [facility] Cardiac and Pulmonary Rehab  Date  03/22/19      Visit Diagnosis: Chronic obstructive pulmonary disease, unspecified COPD type ([place])  Patient's Home Medications on Admission:  Current Outpatient Medications:  .  albuterol (ACCUNEB) 0.63 MG/3ML nebulizer solution, Take 1 ampule by nebulization every 6 (six) hours as needed for wheezing., Disp: , Rfl:  .  albuterol (VENTOLIN HFA) 108 (90 Base) MCG/ACT inhaler, Inhale 1-2 puffs into the lungs every 4 (four) hours as needed for wheezing or shortness of breath., Disp: , Rfl:  .  budesonide-formoterol (SYMBICORT) 160-4.5 MCG/ACT inhaler, Inhale 2 puffs into the lungs daily. , Disp: , Rfl:  .  montelukast (SINGULAIR) 10 MG tablet, Take 10 mg by mouth at bedtime. , Disp: , Rfl:  .  nitrofurantoin, macrocrystal-monohydrate, (MACROBID) 100 MG capsule, Take 1 capsule (100 mg total) by mouth 2 (two) times daily. (Patient not taking: Reported on 04/18/2019), Disp: 10 capsule, Rfl: 0 .  nitrofurantoin, macrocrystal-monohydrate, (MACROBID) 100 MG capsule, Take 1 capsule (100 mg total) by mouth 2 (two) times daily. (Patient not taking: Reported on 04/02/2019), Disp: 10 capsule, Rfl: 0 .  omeprazole (PRILOSEC) 20 MG capsule, Take 20 mg by mouth daily. , Disp: , Rfl:  .  tiotropium (SPIRIVA) 18 MCG inhalation capsule, Place into inhaler and inhale., Disp: , Rfl:   Past Medical History: Past Medical History:  Diagnosis Date  . Asthma   . CHF (congestive heart failure) (HCC)     Tobacco Use: Social History   Tobacco Use  Smoking Status Former Smoker  . Packs/day: 3.00  . Years: 47.00  . Pack years: 141.00  . Types: Cigarettes  .  Start date: 04/18/1971  . Quit date: 01/02/2019  . Years since quitting: 0.4  Smokeless Tobacco Never Used    Labs: Recent Review Flowsheet Data    There is no flowsheet data to display.       Pulmonary Assessment Scores: Pulmonary Assessment Scores    Row Name 03/22/19 0907         ADL UCSD   ADL Phase  Entry     SOB Score total  86     Rest  0     Walk  3     Stairs  5     Bath  4     Dress  4     Shop  5       CAT Score   CAT Score  28       mMRC Score   mMRC Score  4        UCSD: Self-administered rating of dyspnea associated with activities of daily living (ADLs) 6-point scale (0 = "not at all" to 5 = "maximal or unable to do because of breathlessness")  Scoring Scores range from 0 to 120.  Minimally important difference is 5 units  CAT: CAT can identify the health impairment of COPD patients and is better correlated with disease progression.  CAT has a scoring range of zero to 40. The CAT score is classified into four groups of low (less than 10), medium (10 - 20), high (21-30) and very high (31-40) based on the impact  level of disease on health status. A CAT score over 10 suggests significant symptoms.  A worsening CAT score could be explained by an exacerbation, poor medication adherence, poor inhaler technique, or progression of COPD or comorbid conditions.  CAT MCID is 2 points  mMRC: mMRC (Modified Medical Research Council) Dyspnea Scale is used to assess the degree of baseline functional disability in patients of respiratory disease due to dyspnea. No minimal important difference is established. A decrease in score of 1 point or greater is considered a positive change.   Pulmonary Function Assessment: Pulmonary Function Assessment - 03/22/19 0927      Initial Spirometry Results   FVC%  77 %   02/07/2019   FEV1%  53 %    FEV1/FVC Ratio  55      Post Bronchodilator Spirometry Results   FVC%  81 %    FEV1%  58 %    FEV1/FVC Ratio  57      Breath    Shortness of Breath  Yes;Fear of Shortness of Breath;Limiting activity;Panic with Shortness of Breath       Exercise Target Goals: Exercise Program Goal: Individual exercise prescription set using results from initial 6 min walk test and THRR while considering  patient's activity barriers and safety.   Exercise Prescription Goal: Initial exercise prescription builds to 30-45 minutes a day of aerobic activity, 2-3 days per week.  Home exercise guidelines will be given to patient during program as part of exercise prescription that the participant will acknowledge.  Activity Barriers & Risk Stratification: Activity Barriers & Cardiac Risk Stratification - 03/21/19 1111      Activity Barriers & Cardiac Risk Stratification   Activity Barriers  Joint Problems;Other (comment);Deconditioning;Muscular Weakness;Shortness of Breath    Comments  occasional bilateral foot cramps on top of foot after being on feet for prolonged period of time (uses [employer] to treat pain)       6 Minute Walk: 6 Minute Walk    Row Name 03/22/19 0858         6 Minute Walk   Phase  Initial     Distance  1200 feet     Walk Time  6 minutes     # of Rest Breaks  0     MPH  2.27     METS  2.33     RPE  15     Perceived Dyspnea   4     VO2 Peak  8.14     Symptoms  Yes (comment)     Comments  dizzy (improved with rest), back pain 6/10, SOB     Resting HR  75 bpm     Resting BP  114/66     Resting Oxygen Saturation   96 %     Exercise Oxygen Saturation  during 6 min walk  87 %     Max Ex. HR  117 bpm     Max Ex. BP  144/70     2 Minute Post BP  136/72       Interval HR   1 Minute HR  100     2 Minute HR  110     3 Minute HR  116     4 Minute HR  117     5 Minute HR  115     6 Minute HR  109     2 Minute Post HR  79     Interval Heart Rate?  Yes  Interval Oxygen   Interval Oxygen?  Yes     Baseline Oxygen Saturation %  96 %     1 Minute Oxygen Saturation %  88 %     1 Minute Liters of  Oxygen  0 L Room Air     2 Minute Oxygen Saturation %  87 %     2 Minute Liters of Oxygen  0 L     3 Minute Oxygen Saturation %  88 %     3 Minute Liters of Oxygen  0 L     4 Minute Oxygen Saturation %  89 %     4 Minute Liters of Oxygen  0 L     5 Minute Oxygen Saturation %  87 %     5 Minute Liters of Oxygen  0 L     6 Minute Oxygen Saturation %  88 %     6 Minute Liters of Oxygen  0 L     2 Minute Post Oxygen Saturation %  94 %     2 Minute Post Liters of Oxygen  0 L       Oxygen Initial Assessment: Oxygen Initial Assessment - 03/21/19 1116      Home Oxygen   Home Oxygen Device  None    Sleep Oxygen Prescription  CPAP   just completed sleep study   Home Exercise Oxygen Prescription  None    Home at Rest Exercise Oxygen Prescription  None    Compliance with Home Oxygen Use  Yes      Initial 6 min Walk   Oxygen Used  None      Program Oxygen Prescription   Program Oxygen Prescription  None      Intervention   Short Term Goals  To learn and exhibit compliance with exercise, home and travel O2 prescription;To learn and understand importance of monitoring SPO2 with pulse oximeter and demonstrate accurate use of the pulse oximeter.;To learn and understand importance of maintaining oxygen saturations>88%;To learn and demonstrate proper pursed lip breathing techniques or other breathing techniques.;To learn and demonstrate proper use of respiratory medications    Long  Term Goals  Exhibits compliance with exercise, home and travel O2 prescription;Verbalizes importance of monitoring SPO2 with pulse oximeter and return demonstration;Maintenance of O2 saturations>88%;Exhibits proper breathing techniques, such as pursed lip breathing or other method taught during program session;Compliance with respiratory medication;Demonstrates proper use of MDI's       Oxygen Re-Evaluation: Oxygen Re-Evaluation    Row Name 03/29/19 0823 04/17/19 1146           Program Oxygen Prescription    Program Oxygen Prescription  None  None        Home Oxygen   Home Oxygen Device  None  None      Sleep Oxygen Prescription  CPAP  CPAP      Home Exercise Oxygen Prescription  None  None      Home at Rest Exercise Oxygen Prescription  None  None      Compliance with Home Oxygen Use  Yes  Yes        Goals/Expected Outcomes   Short Term Goals  To learn and demonstrate proper pursed lip breathing techniques or other breathing techniques.;To learn and understand importance of monitoring SPO2 with pulse oximeter and demonstrate accurate use of the pulse oximeter.;To learn and understand importance of maintaining oxygen saturations>88%  To learn and demonstrate proper pursed lip breathing techniques or other breathing techniques.;To learn and  understand importance of monitoring SPO2 with pulse oximeter and demonstrate accurate use of the pulse oximeter.;To learn and understand importance of maintaining oxygen saturations>88%      Long  Term Goals  Maintenance of O2 saturations>88%;Exhibits proper breathing techniques, such as pursed lip breathing or other method taught during program session;Verbalizes importance of monitoring SPO2 with pulse oximeter and return demonstration  Maintenance of O2 saturations>88%;Exhibits proper breathing techniques, such as pursed lip breathing or other method taught during program session;Verbalizes importance of monitoring SPO2 with pulse oximeter and return demonstration      Comments  Short: Use RPE daily to regulate intensity. Long: Follow program prescription in THR.  Baker Janus is compliant with her CPAP but is still having some bad breathing days.  She works on using her PLB those days and less active. She is using her pulse oximeter at home and has noted on these days that it drops to 87-84% but she has not told the doctor yet.  She has had sleep study with desaturation but not enough for oxygen therapy. She has noted that it drops with weight bearing activity and may  benefit from oxygen.  We talked about talking with her doctor about it.      Goals/Expected Outcomes  Short: Become more profiecient at using PLB.   Long: Become independent at using PLB.  Short: Contact pulmonologist about sats  Long: Continue to use PLB and managing breathing.         Oxygen Discharge (Final Oxygen Re-Evaluation): Oxygen Re-Evaluation - 04/17/19 1146      Program Oxygen Prescription   Program Oxygen Prescription  None      Home Oxygen   Home Oxygen Device  None    Sleep Oxygen Prescription  CPAP    Home Exercise Oxygen Prescription  None    Home at Rest Exercise Oxygen Prescription  None    Compliance with Home Oxygen Use  Yes      Goals/Expected Outcomes   Short Term Goals  To learn and demonstrate proper pursed lip breathing techniques or other breathing techniques.;To learn and understand importance of monitoring SPO2 with pulse oximeter and demonstrate accurate use of the pulse oximeter.;To learn and understand importance of maintaining oxygen saturations>88%    Long  Term Goals  Maintenance of O2 saturations>88%;Exhibits proper breathing techniques, such as pursed lip breathing or other method taught during program session;Verbalizes importance of monitoring SPO2 with pulse oximeter and return demonstration    Comments  Baker Janus is compliant with her CPAP but is still having some bad breathing days.  She works on using her PLB those days and less active. She is using her pulse oximeter at home and has noted on these days that it drops to 87-84% but she has not told the doctor yet.  She has had sleep study with desaturation but not enough for oxygen therapy. She has noted that it drops with weight bearing activity and may benefit from oxygen.  We talked about talking with her doctor about it.    Goals/Expected Outcomes  Short: Contact pulmonologist about sats  Long: Continue to use PLB and managing breathing.       Initial Exercise Prescription: Initial Exercise  Prescription - 03/22/19 0900      Date of Initial Exercise RX and Referring Provider   Date  03/22/19    Referring Provider  Thad Ranger MD      Treadmill   MPH  1.7    Grade  0    Minutes  15    METs  2.3      NuStep   Level  1    SPM  80    Minutes  15    METs  2      Biostep-RELP   Level  1    SPM  50    Minutes  15    METs  2      Prescription Details   Frequency (times per week)  1    Duration  Progress to 30 minutes of continuous aerobic without signs/symptoms of physical distress      Intensity   THRR 40-80% of Max Heartrate  110-145    Ratings of Perceived Exertion  11-13    Perceived Dyspnea  0-4      Progression   Progression  Continue to progress workloads to maintain intensity without signs/symptoms of physical distress.      Resistance Training   Training Prescription  Yes    Weight  3 lbs    Reps  10-15       Perform Capillary Blood Glucose checks as needed.  Exercise Prescription Changes: Exercise Prescription Changes    Row Name 03/22/19 0900 04/02/19 1500 04/18/19 1000 05/01/19 1300 05/16/19 1200     Response to Exercise   Blood Pressure (Admit)  114/66  146/64  122/70  124/70  124/68   Blood Pressure (Exercise)  144/70  144/72  126/62  132/74  126/70   Blood Pressure (Exit)  124/70  132/80  118/58  110/66  116/62   Heart Rate (Admit)  75 bpm  71 bpm  88 bpm  70 bpm  82 bpm   Heart Rate (Exercise)  117 bpm  125 bpm  95 bpm  106 bpm  96 bpm   Heart Rate (Exit)  76 bpm  84 bpm  77 bpm  79 bpm  80 bpm   Oxygen Saturation (Admit)  96 %  --  96 %  98 %  95 %   Oxygen Saturation (Exercise)  87 %  --  94 %  93 %  93 %   Oxygen Saturation (Exit)  94 %  --  93 %  93 %  94 %   Rating of Perceived Exertion (Exercise)  _0 Perceived Dyspnea (Exercise)  _1 0   Symptoms  dizzy, back pain (6/10), SOB  --  SOB  --  none   Comments  walk test results  --  --  --  --   Duration  --  Continue with 30 min of aerobic exercise  without signs/symptoms of physical distress.  Continue with 30 min of aerobic exercise without signs/symptoms of physical distress.  Progress to 30 minutes of  aerobic without signs/symptoms of physical distress  Continue with 30 min of aerobic exercise without signs/symptoms of physical distress.   Intensity  --  THRR unchanged  THRR unchanged  THRR unchanged  THRR unchanged     Progression   Progression  --  Continue to progress workloads to maintain intensity without signs/symptoms of physical distress.  Continue to progress workloads to maintain intensity without signs/symptoms of physical distress.  Continue to progress workloads to maintain intensity without signs/symptoms of physical distress.  Continue to progress workloads to maintain intensity without signs/symptoms of physical distress.   Average METs  --  2.7  2.23  2.1  2.17     Resistance  Training   Training Prescription  --  Yes  Yes  Yes  Yes   Weight  --  3 lb  3 lb  3 lb  3 lb   Reps  --  10-15  10-15  10-15  10-15     Interval Training   Interval Training  --  --  No  No  No     Bike   Level  --  3  2.3  --  --   Minutes  --  15  15  --  --   METs  --  2.7  2.37  --  --     NuStep   Level  --  --  _0 SPM  --  --  --  80  --   Minutes  --  --  _1 METs  --  --  1.8  2.2  1.7     Arm Ergometer   Level  --  1  1  --  --   Minutes  --  15  15  --  --   METs  --  --  2.4  --  --     T5 Nustep   Level  --  --  --  --  3   Minutes  --  --  --  --  15   METs  --  --  --  --  2.1     Biostep-RELP   Level  --  --  1  2  --   SPM  --  --  --  50  --   Minutes  --  --  15  15  --   METs  --  --  2  2  --     Home Exercise Plan   Plans to continue exercise at  --  --  Home (comment) walking, staff videos  Home (comment) walking, staff videos  Home (comment) walking, staff videos   Frequency  --  --  Add 2 additional days to program exercise sessions.  Add 2 additional days to program exercise  sessions.  Add 2 additional days to program exercise sessions.   Initial Home Exercises Provided  --  --  04/17/19  04/17/19  04/17/19   Row Name 05/29/19 1400             Response to Exercise   Blood Pressure (Admit)  128/80       Blood Pressure (Exercise)  158/64       Blood Pressure (Exit)  116/74       Heart Rate (Admit)  59 bpm       Heart Rate (Exercise)  102 bpm       Heart Rate (Exit)  74 bpm       Oxygen Saturation (Admit)  95 %       Oxygen Saturation (Exercise)  90 %       Oxygen Saturation (Exit)  94 %       Rating of Perceived Exertion (Exercise)  13       Perceived Dyspnea (Exercise)  3       Symptoms  none       Duration  Continue with 30 min of aerobic exercise without signs/symptoms of physical distress.       Intensity  THRR unchanged         Progression   Progression  Continue to  progress workloads to maintain intensity without signs/symptoms of physical distress.       Average METs  2.5         Resistance Training   Training Prescription  Yes       Weight  3 lb       Reps  10-15         Interval Training   Interval Training  No         Treadmill   MPH  2.7       Grade  0       Minutes  15       METs  3.07         T5 Nustep   Level  3       SPM  80       Minutes  15       METs  2         Home Exercise Plan   Plans to continue exercise at  Home (comment) walking, staff videos       Frequency  Add 2 additional days to program exercise sessions.       Initial Home Exercises Provided  04/17/19          Exercise Comments: Exercise Comments    Row Name 03/29/19 [phone]           Exercise Comments  First full day of exercise!  Patient was oriented to gym and equipment including functions, settings, policies, and procedures.  Patient's individual exercise prescription and treatment plan were reviewed.  All starting workloads were established based on the results of the 6 minute walk test done at initial orientation visit.  The plan for exercise  progression was also introduced and progression will be customized based on patient's performance and goals..          Exercise Goals and Review: Exercise Goals    Row Name 03/22/19 0904             Exercise Goals   Increase Physical Activity  Yes       Intervention  Provide advice, education, support and counseling about physical activity/exercise needs.;Develop an individualized exercise prescription for aerobic and resistive training based on initial evaluation findings, risk stratification, comorbidities and participant's personal goals.       Expected Outcomes  Short Term: Attend rehab on a regular basis to increase amount of physical activity.;Long Term: Add in home exercise to make exercise part of routine and to increase amount of physical activity.;Long Term: Exercising regularly at least 3-5 days a week.       Increase Strength and Stamina  Yes       Intervention  Provide advice, education, support and counseling about physical activity/exercise needs.       Expected Outcomes  Short Term: Increase workloads from initial exercise prescription for resistance, speed, and METs.;Short Term: Perform resistance training exercises routinely during rehab and add in resistance training at home;Long Term: Improve cardiorespiratory fitness, muscular endurance and strength as measured by increased METs and functional capacity (6MWT)       Able to understand and use rate of perceived exertion (RPE) scale  Yes       Intervention  Provide education and explanation on how to use RPE scale       Expected Outcomes  Short Term: Able to use RPE daily in rehab to express subjective intensity level;Long Term:  Able to use RPE to guide intensity level when exercising independently  Able to understand and use Dyspnea scale  Yes       Intervention  Provide education and explanation on how to use Dyspnea scale       Expected Outcomes  Short Term: Able to use Dyspnea scale daily in rehab to express  subjective sense of shortness of breath during exertion;Long Term: Able to use Dyspnea scale to guide intensity level when exercising independently       Knowledge and understanding of Target Heart Rate Range (THRR)  Yes       Intervention  Provide education and explanation of THRR including how the numbers were predicted and where they are located for reference       Expected Outcomes  Short Term: Able to state/look up THRR;Short Term: Able to use daily as guideline for intensity in rehab;Long Term: Able to use THRR to govern intensity when exercising independently       Able to check pulse independently  Yes       Intervention  Provide education and demonstration on how to check pulse in carotid and radial arteries.;Review the importance of being able to check your own pulse for safety during independent exercise       Expected Outcomes  Short Term: Able to explain why pulse checking is important during independent exercise;Long Term: Able to check pulse independently and accurately       Understanding of Exercise Prescription  Yes       Intervention  Provide education, explanation, and written materials on patient's individual exercise prescription       Expected Outcomes  Short Term: Able to explain program exercise prescription;Long Term: Able to explain home exercise prescription to exercise independently          Exercise Goals Re-Evaluation : Exercise Goals Re-Evaluation    Row Name 03/29/19 2355 04/17/19 1146 05/01/19 1338 05/01/19 1339 05/16/19 1218     Exercise Goal Re-Evaluation   Exercise Goals Review  Able to understand and use Dyspnea scale;Able to understand and use rate of perceived exertion (RPE) scale;Knowledge and understanding of Target Heart Rate Range (THRR);Able to check pulse independently;Understanding of Exercise Prescription  Increase Physical Activity;Increase Strength and Stamina;Understanding of Exercise Prescription  Increase Physical Activity;Increase Strength and  Stamina;Able to understand and use rate of perceived exertion (RPE) scale;Able to understand and use Dyspnea scale;Knowledge and understanding of Target Heart Rate Range (THRR);Able to check pulse independently;Understanding of Exercise Prescription  --  Increase Physical Activity;Increase Strength and Stamina;Understanding of Exercise Prescription   Comments  Reviewed RPE scale, THR and program prescription with pt today.  Pt voiced understanding and was given a copy of goals to take home.  Baker Janus is off to a good start in rehab.  She is already walking 1-2 miles a day.  --  Baker Janus is progressing well. She is at level 4 on NS and up to 2.7 mph walking.  Baker Janus is doing well in rehab.  She has now also added in pelvic floor therapy into her exericse routine.  She had a fall over the weekend and is wearing a brace on her wrist.  She up to 20 watts on the recumbent bike now.  We will continue to monitor her progress.   Expected Outcomes  Short: Use RPE daily to regulate intensity. Long: Follow program prescription in THR.  Short: Continue to add in exercise at home.  Long: Continue to improve stamina.  --  Short : continue to progress workloads Long: increase overall stamina  Short: Return to  full exercise  Long: Continue to improve stamina   Row Name 05/24/19 1154             Exercise Goal Re-Evaluation   Exercise Goals Review  Increase Physical Activity;Increase Strength and Stamina;Understanding of Exercise Prescription       Comments  Baker Janus is doing well in rehab.  She has now also added in pelvic floor therapy into her exericse routine. Pt is going to [employer] 5x/week (1.5 hours) and does weights everyday - she will rotate machines at [employer] and does resistance using the videos we have provided using 3 pound weights. Pt reports with rehab she is exercising 7x/week - encouraged pt that she is doing well, but to consider at least one rest day which could include light movement - Estill Bamberg also spoke  to her during goals to address this; one day of rest doing light exercise like stretching and PT - pt reports that she feels like her breathing gets worse when she misses one day, Estill Bamberg discussed doing T5/T4 with light RPE so that her body can rest; pt reports getting enough sleep.  We will continue to monitor progress.       Expected Outcomes  Short: continue with exercise at home   Long: Continue to improve stamina          Discharge Exercise Prescription (Final Exercise Prescription Changes): Exercise Prescription Changes - 05/29/19 1400      Response to Exercise   Blood Pressure (Admit)  128/80    Blood Pressure (Exercise)  158/64    Blood Pressure (Exit)  116/74    Heart Rate (Admit)  59 bpm    Heart Rate (Exercise)  102 bpm    Heart Rate (Exit)  74 bpm    Oxygen Saturation (Admit)  95 %    Oxygen Saturation (Exercise)  90 %    Oxygen Saturation (Exit)  94 %    Rating of Perceived Exertion (Exercise)  13    Perceived Dyspnea (Exercise)  3    Symptoms  none    Duration  Continue with 30 min of aerobic exercise without signs/symptoms of physical distress.    Intensity  THRR unchanged      Progression   Progression  Continue to progress workloads to maintain intensity without signs/symptoms of physical distress.    Average METs  2.5      Resistance Training   Training Prescription  Yes    Weight  3 lb    Reps  10-15      Interval Training   Interval Training  No      Treadmill   MPH  2.7    Grade  0    Minutes  15    METs  3.07      T5 Nustep   Level  3    SPM  80    Minutes  15    METs  2      Home Exercise Plan   Plans to continue exercise at  Home (comment)   walking, staff videos   Frequency  Add 2 additional days to program exercise sessions.    Initial Home Exercises Provided  04/17/19       Nutrition:  Target Goals: Understanding of nutrition guidelines, daily intake of sodium <1533m, cholesterol <2023m calories 30% from fat and 7% or less from  saturated fats, daily to have 5 or more servings of fruits and vegetables.  Biometrics: Pre Biometrics - 03/22/19 099977  Pre Biometrics   Height  5' 2.9" (1.598 m)    Weight  243 lb (110.2 kg)    BMI (Calculated)  43.16    Single Leg Stand  15.81 seconds        Nutrition Therapy Plan and Nutrition Goals: Nutrition Therapy & Goals - 04/05/19 1502      Nutrition Therapy   Diet  Low Na, HH diet    Protein (specify units)  90g    Fiber  25 grams    Whole Grain Foods  3 servings    Saturated Fats  12 max. grams    Fruits and Vegetables  5 servings/day    Sodium  1.5 grams      Personal Nutrition Goals   Nutrition Goal  ST: Add in more vegetables LT: pt wants to lose weight    Comments  trying to keep kcals 1500 and CHO at 150g. Uses boost protein shakes (AM with multivitamin). 2 low CHO tortillas with canadian bacon. piece of fruit. pears with low sugar syrup and cottage cheese or [place] sandwich with white wheat. Pork skins snack or jerky (b/c low CHO). Pork chop, roasted vegetables, and corn. Sugar free jello. no sugar crystal light tea or water. Pt reports having IBS and doesn't like eggs, chicken, and a lot of vegetables. Talked about sneaking in some vegetables - ex: smoothie and spinach. Discussed HH eating, honoring hunger, and increased needs due to pumonary issues.      Intervention Plan   Intervention  Prescribe, educate and counsel regarding individualized specific dietary modifications aiming towards targeted core components such as weight, hypertension, lipid management, diabetes, heart failure and other comorbidities.;Nutrition handout(s) given to patient.    Expected Outcomes  Short Term Goal: Understand basic principles of dietary content, such as calories, fat, sodium, cholesterol and nutrients.;Short Term Goal: A plan has been developed with personal nutrition goals set during dietitian appointment.;Long Term Goal: Adherence to prescribed nutrition plan.        Nutrition Assessments: Nutrition Assessments - 03/22/19 0905      MEDFICTS Scores   Pre Score  62       Nutrition Goals Re-Evaluation: Nutrition Goals Re-Evaluation    [place] Name 05/08/19 1143 05/24/19 1201           Goals   Nutrition Goal  ST: Add in more vegetables LT: pt wants to lose weight  ST: Add in more vegetables LT: pt wants to lose weight      Comment  Pt reports being able to add 1 extra vegetable per meal. Pt reports that she found a meal app that uses healthy recipes and gives you a grocery list for them as well. Pt reports this has helped her eat less fried foods.  Pt reports doing well and is using a ninja grill - cooking meat and vegetables in more HH ways. Getting a variety of colors. Pt reports doing well and would like to continue with what she is currently doing.      Expected Outcome  ST: Add in more vegetables LT: pt wants to lose weight  ST: Add in more vegetables LT: pt wants to lose weight         Nutrition Goals Discharge (Final Nutrition Goals Re-Evaluation): Nutrition Goals Re-Evaluation - 05/24/19 1201      Goals   Nutrition Goal  ST: Add in more vegetables LT: pt wants to lose weight    Comment  Pt reports doing well and is using a ninja  grill - cooking meat and vegetables in more HH ways. Getting a variety of colors. Pt reports doing well and would like to continue with what she is currently doing.    Expected Outcome  ST: Add in more vegetables LT: pt wants to lose weight       Psychosocial: Target Goals: Acknowledge presence or absence of significant depression and/or stress, maximize coping skills, provide positive support system. Participant is able to verbalize types and ability to use techniques and skills needed for reducing stress and depression.   Initial Review & Psychosocial Screening: Initial Psych Review & Screening - 03/21/19 1118      Initial Review   Current issues with  Current Depression;Current Anxiety/Panic;Current Stress  Concerns    Source of Stress Concerns  Unable to participate in former interests or hobbies;Unable to perform yard/household activities;Retirement/disability;Financial;Chronic Illness    Comments  Currently applying for disability (first round was denied), has tried Xanax before, but not currently on it, anti-depressive make suicidal ideations worse, bowel and bladder incontenince (vaginal fault) and has appt with [facility]?  Yes   six children (three are local)   Comments  Three of her six children live in the area and has a granddaughter that lives with her to help out.      Barriers   Psychosocial barriers to participate in program  The patient should benefit from training in stress management and relaxation.;There are no identifiable barriers or psychosocial needs.      Screening Interventions   Interventions  Encouraged to exercise;Provide feedback about the scores to participant;To provide support and resources with identified psychosocial needs    Expected Outcomes  Long Term Goal: Stressors or current issues are controlled or eliminated.;Short Term goal: Identification and review with participant of any Quality of Life or Depression concerns found by scoring the questionnaire.;Long Term goal: The participant improves quality of Life and PHQ9 Scores as seen by post scores and/or verbalization of changes       Quality of Life Scores:  Scores of 19 and below usually indicate a poorer quality of life in these areas.  A difference of  2-3 points is a clinically meaningful difference.  A difference of 2-3 points in the total score of the Quality of Life Index has been associated with significant improvement in overall quality of life, self-image, physical symptoms, and general health in studies assessing change in quality of life.  PHQ-9: Recent Review Flowsheet Data    Depression screen [facility] 2/9 05/03/2019 04/16/2019 03/22/2019   Decreased Interest _0 Down, Depressed, Hopeless _1 PHQ - 2 Score _2 Altered sleeping _3 Tired, decreased energy _4 Change in appetite 2 0 3   Feeling bad or failure about yourself  _5 Trouble concentrating _6 Moving slowly or fidgety/restless 2 0 0   Suicidal thoughts 0 0 0   PHQ-9 Score _7 Difficult doing work/chores Somewhat difficult Somewhat difficult Very difficult     Interpretation of Total Score  Total Score Depression Severity:  1-4 = Minimal depression, 5-9 = Mild depression, 10-14 = Moderate depression, 15-19 = Moderately severe depression, 20-27 = Severe depression   Psychosocial Evaluation and Intervention: Psychosocial Evaluation - 03/21/19 1129      Psychosocial Evaluation & Interventions  Interventions  Stress management education;Encouraged to exercise with the program and follow exercise prescription    Comments  Baker Janus is coming to pulmonary rehab.  She is coming in for COPD and had a recent hospitalization for an exacerbation and hypoxia.  She has a significant history of depression and anxiety.  She feels that her depression is currently under control but her anxiety is starting to spiral.  In the past she was on xanax for anxiety and it worked and helped her sleep, but she was taken off of it.  She does not do well on anti-depression meds as they seem to make her suicidal ideations worse.  She is currently thinking about seeing someone at the [facility] for help with her anxiety.  She is also hoping that the exercise will help as well.  She is looking forwad to the program.  She wants to learn to be comfortable with exercise and build her confidence back up.  She doesn't want to continue to worry about her heart and sats dropping when she exercises.  She will benefit from exercise from the strength and stamina side as well.  She also has issues with bowel and urinary incontenince that ofter leads to infections.  She is planning to see her OB-GYN about  this and is considering pelvic floor therapy as well.    Expected Outcomes  Short: Attend rehab regularly to build confidence in her ability to exercise.  Long: Better management of anxiety.    Continue Psychosocial Services   Follow up required by staff       Psychosocial Re-Evaluation: Psychosocial Re-Evaluation    [place] Name 04/16/19 [id number] 04/17/19 1153 05/24/19 1151         Psychosocial Re-Evaluation   Current issues with  Current Depression  Current Depression  Current Depression     Comments  PHQ 9 repeated  Score decreased 17 to 7 today.  Baker Janus is doing better now that she is in rehab.  She is still getting frustrated with sats dropping at home, but we made a plan to start to talk to the doctor about it.  We also reviewed  COPD action plan to help manage breathing. Her depression is getting better overall.  Pt taking effexor 37.5 dose. Pt reports feeling better now on the new medication, but is still frustrated about her breathing and not having an O2 tank. Support system is children- Curator and mom live with her.     Expected Outcomes  STG continues decrease in PHQ 9 score as attends program and works on healing. LTG: Phq9 in normal range  Short: Continue to attend rehab and talk to doctor.  Long: Work on positive talk and self care.  Short: Continue to attend rehab and talk to doctor.  Long: Work on positive talk and self care.     Interventions  Encouraged to attend Pulmonary Rehabilitation for the exercise  Encouraged to attend Pulmonary Rehabilitation for the exercise;Stress management education;Relaxation education  Encouraged to attend Pulmonary Rehabilitation for the exercise;Stress management education;Relaxation education     Continue Psychosocial Services   Follow up required by staff  Follow up required by staff  Follow up required by staff        Psychosocial Discharge (Final Psychosocial Re-Evaluation): Psychosocial Re-Evaluation - 05/24/19 1151      Psychosocial  Re-Evaluation   Current issues with  Current Depression    Comments  Pt taking effexor 37.5 dose. Pt reports feeling better now on the new medication, but  is still frustrated about her breathing and not having an O2 tank. Support system is children- Curator and mom live with her.    Expected Outcomes  Short: Continue to attend rehab and talk to doctor.  Long: Work on positive talk and self care.    Interventions  Encouraged to attend Pulmonary Rehabilitation for the exercise;Stress management education;Relaxation education    Continue Psychosocial Services   Follow up required by staff       Education: Education Goals: Education classes will be provided on a weekly basis, covering required topics. Participant will state understanding/return demonstration of topics presented.  Learning Barriers/Preferences: Learning Barriers/Preferences - 03/21/19 1113      Learning Barriers/Preferences   Learning Barriers  Reading;Sight   occasional "brain fog", glasses   Learning Preferences  Written Material       Education Topics:  Initial Evaluation Education: - Verbal, written and demonstration of respiratory meds, oximetry and breathing techniques. Instruction on use of nebulizers and MDIs and importance of monitoring MDI activations.   Pulmonary Rehab from 03/22/2019 in [facility] Cardiac and Pulmonary Rehab  Date  03/22/19  Educator  [facility]  Instruction Review Code  1- Verbalizes Understanding      General Nutrition Guidelines/Fats and Fiber: -Group instruction provided by verbal, written material, models and posters to present the general guidelines for heart healthy nutrition. Gives an explanation and review of dietary fats and fiber.   Controlling Sodium/Reading Food Labels: -Group verbal and written material supporting the discussion of sodium use in heart healthy nutrition. Review and explanation with models, verbal and written materials for utilization of the food label.   Exercise  Physiology & General Exercise Guidelines: - Group verbal and written instruction with models to review the exercise physiology of the cardiovascular system and associated critical values. Provides general exercise guidelines with specific guidelines to those with heart or lung disease.    Aerobic Exercise & Resistance Training: - Gives group verbal and written instruction on the various components of exercise. Focuses on aerobic and resistive training programs and the benefits of this training and how to safely progress through these programs.   Flexibility, Balance, Mind/Body Relaxation: Provides group verbal/written instruction on the benefits of flexibility and balance training, including mind/body exercise modes such as yoga, pilates and tai chi.  Demonstration and skill practice provided.   Stress and Anxiety: - Provides group verbal and written instruction about the health risks of elevated stress and causes of high stress.  Discuss the correlation between heart/lung disease and anxiety and treatment options. Review healthy ways to manage with stress and anxiety.   Depression: - Provides group verbal and written instruction on the correlation between heart/lung disease and depressed mood, treatment options, and the stigmas associated with seeking treatment.   Exercise & Equipment Safety: - Individual verbal instruction and demonstration of equipment use and safety with use of the equipment.   Pulmonary Rehab from 03/22/2019 in [facility] Cardiac and Pulmonary Rehab  Date  03/22/19  Educator  [facility]  Instruction Review Code  1- Verbalizes Understanding      Infection Prevention: - Provides verbal and written material to individual with discussion of infection control including proper hand washing and proper equipment cleaning during exercise session.   Pulmonary Rehab from 03/22/2019 in [facility] Pulmonary Re[facility]  Date  03/22/19  Educator  [facility]  Instruction Review Code  1- Verbalizes  Understanding      Falls Prevention: - Provides verbal and written material to individual with discussion of  falls prevention and safety.   Pulmonary Rehab from 03/22/2019 in [facility] Cardiac and Pulmonary Rehab  Date  03/22/19  Educator  [facility]  Instruction Review Code  1- Verbalizes Understanding      Diabetes: - Individual verbal and written instruction to review signs/symptoms of diabetes, desired ranges of glucose level fasting, after meals and with exercise. Advice that pre and post exercise glucose checks will be done for 3 sessions at entry of program.   Chronic Lung Diseases: - Group verbal and written instruction to review updates, respiratory medications, advancements in procedures and treatments. Discuss use of supplemental oxygen including available portable oxygen systems, continuous and intermittent flow rates, concentrators, personal use and safety guidelines. Review proper use of inhaler and spacers. Provide informative websites for self-education.    Energy Conservation: - Provide group verbal and written instruction for methods to conserve energy, plan and organize activities. Instruct on pacing techniques, use of adaptive equipment and posture/positioning to relieve shortness of breath.   Triggers and Exacerbations: - Group verbal and written instruction to review types of environmental triggers and ways to prevent exacerbations. Discuss weather changes, air quality and the benefits of nasal washing. Review warning signs and symptoms to help prevent infections. Discuss techniques for effective airway clearance, coughing, and vibrations.   AED/CPR: - Group verbal and written instruction with the use of models to demonstrate the basic use of the AED with the basic ABC's of resuscitation.   Anatomy and Physiology of the Lungs: - Group verbal and written instruction with the use of models to provide basic lung anatomy and physiology related to function, structure and  complications of lung disease.   Anatomy & Physiology of the Heart: - Group verbal and written instruction and models provide basic cardiac anatomy and physiology, with the coronary electrical and arterial systems. Review of Valvular disease and Heart Failure   Cardiac Medications: - Group verbal and written instruction to review commonly prescribed medications for heart disease. Reviews the medication, class of the drug, and side effects.   Know Your Numbers and Risk Factors: -Group verbal and written instruction about important numbers in your health.  Discussion of what are risk factors and how they play a role in the disease process.  Review of Cholesterol, Blood Pressure, Diabetes, and BMI and the role they play in your overall health.   Sleep Hygiene: -Provides group verbal and written instruction about how sleep can affect your health.  Define sleep hygiene, discuss sleep cycles and impact of sleep habits. Review good sleep hygiene tips.    Other: -Provides group and verbal instruction on various topics (see comments)    Knowledge Questionnaire Score: Knowledge Questionnaire Score - 03/22/19 0906      Knowledge Questionnaire Score   Pre Score  16/18 Education Focus: Oxygen Safety        Core Components/Risk Factors/Patient Goals at Admission: Personal Goals and Risk Factors at Admission - 03/22/19 0906      Core Components/Risk Factors/Patient Goals on Admission    Weight Management  Yes;Obesity;Weight Loss    Intervention  Weight Management: Develop a combined nutrition and exercise program designed to reach desired caloric intake, while maintaining appropriate intake of nutrient and fiber, sodium and fats, and appropriate energy expenditure required for the weight goal.;Weight Management: Provide education and appropriate resources to help participant work on and attain dietary goals.;Weight Management/Obesity: Establish reasonable short term and long term weight  goals.;Obesity: Provide education and appropriate resources to help participant work on and attain dietary  goals.    Admit Weight  230 lb (104.3 kg)    Goal Weight: Short Term  225 lb (102.1 kg)    Goal Weight: Long Term  220 lb (99.8 kg)    Expected Outcomes  Short Term: Continue to assess and modify interventions until short term weight is achieved;Long Term: Adherence to nutrition and physical activity/exercise program aimed toward attainment of established weight goal;Weight Loss: Understanding of general recommendations for a balanced deficit meal plan, which promotes 1-2 lb weight loss per week and includes a negative energy balance of [phone] kcal/d;Understanding recommendations for meals to include 15-35% energy as protein, 25-35% energy from fat, 35-60% energy from carbohydrates, less than 246m of dietary cholesterol, 20-35 gm of total fiber daily;Understanding of distribution of calorie intake throughout the day with the consumption of 4-5 meals/snacks    Tobacco Cessation  Yes    Intervention  Offer self-teaching materials, assist with locating and accessing local/national Quit Smoking programs, and support quit date choice.    Expected Outcomes  Short Term: Will quit all tobacco product use, adhering to prevention of relapse plan.;Long Term: Complete abstinence from all tobacco products for at least 12 months from quit date.    Improve shortness of breath with ADL's  Yes    Intervention  Provide education, individualized exercise plan and daily activity instruction to help decrease symptoms of SOB with activities of daily living.    Expected Outcomes  Short Term: Improve cardiorespiratory fitness to achieve a reduction of symptoms when performing ADLs;Long Term: Be able to perform more ADLs without symptoms or delay the onset of symptoms    Heart Failure  Yes    Intervention  Provide a combined exercise and nutrition program that is supplemented with education, support and counseling about  heart failure. Directed toward relieving symptoms such as shortness of breath, decreased exercise tolerance, and extremity edema.    Expected Outcomes  Improve functional capacity of life;Short term: Attendance in program 2-3 days a week with increased exercise capacity. Reported lower sodium intake. Reported increased fruit and vegetable intake. Reports medication compliance.;Short term: Daily weights obtained and reported for increase. Utilizing diuretic protocols set by physician.;Long term: Adoption of self-care skills and reduction of barriers for early signs and symptoms recognition and intervention leading to self-care maintenance.       Core Components/Risk Factors/Patient Goals Review:  Goals and Risk Factor Review    Row Name 04/17/19 1155 05/24/19 1138           Core Components/Risk Factors/Patient Goals Review   Personal Goals Review  Weight Management/Obesity;Tobacco Cessation;Improve shortness of breath with ADL's;Hypertension  Weight Management/Obesity;Tobacco Cessation;Improve shortness of breath with ADL's;Hypertension      Review  Her weight was up recently, but she is trying to get it back down.  She has already started to take steps to improve it again and keep moving.  She is still doing well staying away from cigarettes.  Breathing is improving. and Pressures have been good.  Pt reports that she is still trying to get an O2 tank for exercise. She reports her O2 saturation drops below 90 and were 86 for 5 minutes of a 30 minute workout at her gym. She recently fell off of a treadmil at the gym when she forgot to put the safety on, pt reports that she is fine but hurt her left hand. Pt reports SOB has been the same and is waiting on her doctor to get back to her about the O2 tank.  Pressures have been good. Pt reports doing very well with smoking cessation and reports "I can't believe I ever smoked".      Expected Outcomes  Short: Work on weight loss.  Long: Continue monitor risk  factors.  ST: O2 tank, continue with work outside of the gym  LT: Continue monitor risk factors.         Core Components/Risk Factors/Patient Goals at Discharge (Final Review):  Goals and Risk Factor Review - 05/24/19 1138      Core Components/Risk Factors/Patient Goals Review   Personal Goals Review  Weight Management/Obesity;Tobacco Cessation;Improve shortness of breath with ADL's;Hypertension    Review  Pt reports that she is still trying to get an O2 tank for exercise. She reports her O2 saturation drops below 90 and were 86 for 5 minutes of a 30 minute workout at her gym. She recently fell off of a treadmil at the gym when she forgot to put the safety on, pt reports that she is fine but hurt her left hand. Pt reports SOB has been the same and is waiting on her doctor to get back to her about the O2 tank. Pressures have been good. Pt reports doing very well with smoking cessation and reports "I can't believe I ever smoked".    Expected Outcomes  ST: O2 tank, continue with work outside of the gym  LT: Continue monitor risk factors.       ITP Comments: ITP Comments    Row Name 03/21/19 1140 03/22/19 0857 03/22/19 0858 03/29/19 0818 04/04/19 1429   ITP Comments  Completed virtual orientation today.  EP eval scheduled for 1/14 at 8am.  Documentation for diagnosis can be in [facility] encounter 01/03/19 and 02/28/19.  Completed 6MWT and gym orientation.  Initial ITP created and sent for review to Dr. Emily Filbert, Medical Director.  Hagan has recently quit tobacco use within the last 6 months. Intervention for relapse prevention was provided at the initial medical review. She was encouraged to continue to with tobacco cessation and was provided information on relapse prevention. Patient received information about combination therapy, tobacco cessation classes, quit line, and quit smoking apps in case of a relapse. Patient demonstrated understanding of this material.Staff will continue to provide encouragement  and follow up with the patient throughout the program.  First full day of exercise!  Patient was oriented to gym and equipment including functions, settings, policies, and procedures.  Patient's individual exercise prescription and treatment plan were reviewed.  All starting workloads were established based on the results of the 6 minute walk test done at initial orientation visit.  The plan for exercise progression was also introduced and progression will be customized based on patient's performance and goals..  30 day review completed. ITP sent to Dr. Emily Filbert, Medical Director of Cardiac and Pulmonary Rehab. Continue with ITP unless changes are made by physician.  Department operating under reduced schedule until further notice by request from hospital leadership.  New to program oriented 03/22/19.   [place] Name 04/05/19 1522 04/16/19 0842 05/02/19 0643 05/30/19 0652     ITP Comments  Completed Initial RD Eval  PHQ 9 repeated  Score decreased 17 to 7 today.  30 day chart review completed. ITP sent to Dr Zachery Dakins Medical Director, for review,changes as needed and signature.  30 day chart review completed. ITP sent to Dr Zachery Dakins Medical Director, for review,changes as needed and signature. Continue with ITP if no changes requested       Comments:

## 2019-05-31 ENCOUNTER — Ambulatory Visit: Payer: Medicaid Other

## 2019-06-07 ENCOUNTER — Ambulatory Visit: Payer: Medicaid Other

## 2019-06-07 ENCOUNTER — Encounter (INDEPENDENT_AMBULATORY_CARE_PROVIDER_SITE_OTHER): Payer: Self-pay

## 2019-06-07 ENCOUNTER — Telehealth: Payer: Self-pay | Admitting: *Deleted

## 2019-06-07 NOTE — Telephone Encounter (Signed)
Baker Janus called to inform staff that she has had to go out of town for the next 3 weeks.

## 2019-06-13 ENCOUNTER — Ambulatory Visit: Payer: Medicaid Other | Attending: Internal Medicine

## 2019-06-13 DIAGNOSIS — Z23 Encounter for immunization: Secondary | ICD-10-CM

## 2019-06-13 NOTE — Progress Notes (Signed)
   Covid-19 Vaccination Clinic  Name:  JEANI JARROW    MRN: [id number] DOB: [birth year]  06/13/2019  Ms. Sealock was observed post Covid-19 immunization for 15 minutes without incident. She was provided with Vaccine Information Sheet and instruction to access the V-Safe system.   Ms. Peppers was instructed to call 911 with any severe reactions post vaccine: Marland Kitchen Difficulty breathing  . Swelling of face and throat  . A fast heartbeat  . A bad rash all over body  . Dizziness and weakness   Immunizations Administered    Name Date Dose VIS Date Route   Pfizer COVID-19 Vaccine 06/13/2019 11:30 AM 0.3 mL 02/16/2019 Intramuscular   Manufacturer: [place]   Lot: [id number]   [place]: [id number]

## 2019-06-14 ENCOUNTER — Ambulatory Visit: Payer: Medicaid Other

## 2019-06-15 ENCOUNTER — Other Ambulatory Visit: Payer: Self-pay

## 2019-06-15 ENCOUNTER — Encounter: Payer: Self-pay | Admitting: Pulmonary Disease

## 2019-06-15 ENCOUNTER — Ambulatory Visit: Payer: Medicaid Other | Admitting: Pulmonary Disease

## 2019-06-15 VITALS — BP 118/74 | HR 64 | Temp 97.7°F | Ht 63.0 in | Wt 244.6 lb

## 2019-06-15 DIAGNOSIS — E662 Morbid (severe) obesity with alveolar hypoventilation: Secondary | ICD-10-CM

## 2019-06-15 DIAGNOSIS — J449 Chronic obstructive pulmonary disease, unspecified: Secondary | ICD-10-CM

## 2019-06-15 DIAGNOSIS — R0602 Shortness of breath: Secondary | ICD-10-CM

## 2019-06-15 NOTE — Patient Instructions (Signed)
We will see her in follow-up in 2 months time.  Continue Symbicort and Spiriva and as needed albuterol (ProAir).  We are referring you to the dietitian for weight loss management.  We will send an order to pulmonary rehab that supplemental oxygen can be given if you are saturations drop.  I am going to have you see our sleep physician Dr. Halford Chessman for your sleep issues.

## 2019-06-15 NOTE — Progress Notes (Signed)
    Assessment & Plan:  1. SOB (shortness of breath) (Primary) - AMB referral to pulmonary rehabilitation  2. COPD, moderate (HCC) - AMB referral to pulmonary rehabilitation  3. Obesity with alveolar hypoventilation and body mass index (BMI) of 40 or greater (HCC) - Amb ref to Medical Nutrition Therapy-MNT - AMB referral to pulmonary rehabilitation   Patient Instructions  We will see her in follow-up in 2 months time.  Continue Symbicort and Spiriva and as needed albuterol  (ProAir ).  We are referring you to the dietitian for weight loss management.  We will send an order to pulmonary rehab that supplemental oxygen can be given if you are saturations drop.  I am going to have you see our sleep physician Dr. Shellia for your sleep issues.  Please note: late entry documentation due to logistical difficulties during COVID-19 pandemic. This note is filed for information purposes only, and is not intended to be used for billing, nor does it represent the full scope/nature of the visit in question. Please see any associated scanned media linked to date of encounter for additional pertinent information.  Subjective:    HPI: Terri Hughes is a 59 y.o. female presenting to the pulmonology clinic on 06/15/2019 with report of: Pulmonary Consult (Self Referral for COPD. Patient reports sob with exertion and productive cough with green sputum. )     Outpatient Encounter Medications as of 06/15/2019  Medication Sig   budesonide-formoterol  (SYMBICORT) 160-4.5 MCG/ACT inhaler Inhale 2 puffs into the lungs daily.    montelukast  (SINGULAIR ) 10 MG tablet Take 10 mg by mouth at bedtime.  (Patient not taking: Reported on 05/19/2020)   omeprazole (PRILOSEC) 20 MG capsule Take 20 mg by mouth daily.    tiotropium (SPIRIVA) 18 MCG inhalation capsule Place into inhaler and inhale.   venlafaxine (EFFEXOR) 37.5 MG tablet Take 37.5 mg by mouth daily.   [DISCONTINUED] albuterol  (ACCUNEB ) 0.63 MG/3ML nebulizer  solution Take 1 ampule by nebulization every 6 (six) hours as needed for wheezing.   [DISCONTINUED] albuterol  (VENTOLIN  HFA) 108 (90 Base) MCG/ACT inhaler Inhale 1-2 puffs into the lungs every 4 (four) hours as needed for wheezing or shortness of breath.   [DISCONTINUED] nitrofurantoin , macrocrystal-monohydrate, (MACROBID ) 100 MG capsule Take 1 capsule (100 mg total) by mouth 2 (two) times daily. (Patient not taking: Reported on 04/18/2019)   [DISCONTINUED] nitrofurantoin , macrocrystal-monohydrate, (MACROBID ) 100 MG capsule Take 1 capsule (100 mg total) by mouth 2 (two) times daily. (Patient not taking: Reported on 04/02/2019)   No facility-administered encounter medications on file as of 06/15/2019.      Objective:   Vitals:   06/15/19 0934  BP: 118/74  Pulse: 64  Temp: 97.7 F (36.5 C)  Height: 5' 3 (1.6 m)  Weight: 244 lb 9.6 oz (110.9 kg)  SpO2: 97%  TempSrc: Temporal  BMI (Calculated): 43.34     Physical exam documentation is limited by delayed entry of information.

## 2019-06-20 ENCOUNTER — Ambulatory Visit: Payer: Medicaid Other

## 2019-06-21 ENCOUNTER — Ambulatory Visit: Payer: Medicaid Other

## 2019-06-27 ENCOUNTER — Encounter: Payer: Self-pay | Admitting: *Deleted

## 2019-06-27 DIAGNOSIS — J449 Chronic obstructive pulmonary disease, unspecified: Secondary | ICD-10-CM

## 2019-06-27 NOTE — Progress Notes (Signed)
Pulmonary Individual Treatment Plan  Patient Details  Name: Terri Hughes MRN: [id number] Date of Birth: [birth year] Referring Provider:     Pulmonary Rehab from 03/22/2019 in [facility] Cardiac and Pulmonary Rehab  Referring Provider  Thad Ranger MD      Initial Encounter Date:    Pulmonary Rehab from 03/22/2019 in [facility] Cardiac and Pulmonary Rehab  Date  03/22/19      Visit Diagnosis: Chronic obstructive pulmonary disease, unspecified COPD type ([place])  Patient's Home Medications on Admission:  Current Outpatient Medications:  .  albuterol (ACCUNEB) 0.63 MG/3ML nebulizer solution, Take 1 ampule by nebulization every 6 (six) hours as needed for wheezing., Disp: , Rfl:  .  albuterol (VENTOLIN HFA) 108 (90 Base) MCG/ACT inhaler, Inhale 1-2 puffs into the lungs every 4 (four) hours as needed for wheezing or shortness of breath., Disp: , Rfl:  .  budesonide-formoterol (SYMBICORT) 160-4.5 MCG/ACT inhaler, Inhale 2 puffs into the lungs daily. , Disp: , Rfl:  .  montelukast (SINGULAIR) 10 MG tablet, Take 10 mg by mouth at bedtime. , Disp: , Rfl:  .  omeprazole (PRILOSEC) 20 MG capsule, Take 20 mg by mouth daily. , Disp: , Rfl:  .  tiotropium (SPIRIVA) 18 MCG inhalation capsule, Place into inhaler and inhale., Disp: , Rfl:  .  venlafaxine (EFFEXOR) 37.5 MG tablet, Take 37.5 mg by mouth daily., Disp: , Rfl:   Past Medical History: Past Medical History:  Diagnosis Date  . Asthma   . CHF (congestive heart failure) (HCC)     Tobacco Use: Social History   Tobacco Use  Smoking Status Former Smoker  . Packs/day: 3.00  . Years: 47.00  . Pack years: 141.00  . Types: Cigarettes  . Start date: 04/18/1971  . Quit date: 01/02/2019  . Years since quitting: 0.4  Smokeless Tobacco Never Used    Labs: Recent Review Flowsheet Data    There is no flowsheet data to display.       Pulmonary Assessment Scores: Pulmonary Assessment Scores    Row Name 03/22/19 0907         ADL UCSD   ADL  Phase  Entry     SOB Score total  86     Rest  0     Walk  3     Stairs  5     Bath  4     Dress  4     Shop  5       CAT Score   CAT Score  28       mMRC Score   mMRC Score  4        UCSD: Self-administered rating of dyspnea associated with activities of daily living (ADLs) 6-point scale (0 = "not at all" to 5 = "maximal or unable to do because of breathlessness")  Scoring Scores range from 0 to 120.  Minimally important difference is 5 units  CAT: CAT can identify the health impairment of COPD patients and is better correlated with disease progression.  CAT has a scoring range of zero to 40. The CAT score is classified into four groups of low (less than 10), medium (10 - 20), high (21-30) and very high (31-40) based on the impact level of disease on health status. A CAT score over 10 suggests significant symptoms.  A worsening CAT score could be explained by an exacerbation, poor medication adherence, poor inhaler technique, or progression of COPD or comorbid conditions.  CAT MCID is 2 points  mMRC: mMRC (Modified Medical Research Council) Dyspnea Scale is used to assess the degree of baseline functional disability in patients of respiratory disease due to dyspnea. No minimal important difference is established. A decrease in score of 1 point or greater is considered a positive change.   Pulmonary Function Assessment: Pulmonary Function Assessment - 03/22/19 0927      Initial Spirometry Results   FVC%  77 %   02/07/2019   FEV1%  53 %    FEV1/FVC Ratio  55      Post Bronchodilator Spirometry Results   FVC%  81 %    FEV1%  58 %    FEV1/FVC Ratio  57      Breath   Shortness of Breath  Yes;Fear of Shortness of Breath;Limiting activity;Panic with Shortness of Breath       Exercise Target Goals: Exercise Program Goal: Individual exercise prescription set using results from initial 6 min walk test and THRR while considering  patient's activity barriers and safety.    Exercise Prescription Goal: Initial exercise prescription builds to 30-45 minutes a day of aerobic activity, 2-3 days per week.  Home exercise guidelines will be given to patient during program as part of exercise prescription that the participant will acknowledge.  Education: Aerobic Exercise & Resistance Training: - Gives group verbal and written instruction on the various components of exercise. Focuses on aerobic and resistive training programs and the benefits of this training and how to safely progress through these programs..   Education: Exercise & Equipment Safety: - Individual verbal instruction and demonstration of equipment use and safety with use of the equipment.   Pulmonary Rehab from 03/22/2019 in [facility] Cardiac and Pulmonary Rehab  Date  03/22/19  Educator  [facility]  Instruction Review Code  1- Verbalizes Understanding      Education: Exercise Physiology & General Exercise Guidelines: - Group verbal and written instruction with models to review the exercise physiology of the cardiovascular system and associated critical values. Provides general exercise guidelines with specific guidelines to those with heart or lung disease.    Education: Flexibility, Balance, Mind/Body Relaxation: Provides group verbal/written instruction on the benefits of flexibility and balance training, including mind/body exercise modes such as yoga, pilates and tai chi.  Demonstration and skill practice provided.   Activity Barriers & Risk Stratification: Activity Barriers & Cardiac Risk Stratification - 03/21/19 1111      Activity Barriers & Cardiac Risk Stratification   Activity Barriers  Joint Problems;Other (comment);Deconditioning;Muscular Weakness;Shortness of Breath    Comments  occasional bilateral foot cramps on top of foot after being on feet for prolonged period of time (uses [employer] to treat pain)       6 Minute Walk: 6 Minute Walk    Row Name 03/22/19 0858         6 Minute  Walk   Phase  Initial     Distance  1200 feet     Walk Time  6 minutes     # of Rest Breaks  0     MPH  2.27     METS  2.33     RPE  15     Perceived Dyspnea   4     VO2 Peak  8.14     Symptoms  Yes (comment)     Comments  dizzy (improved with rest), back pain 6/10, SOB     Resting HR  75 bpm     Resting BP  114/66  Resting Oxygen Saturation   96 %     Exercise Oxygen Saturation  during 6 min walk  87 %     Max Ex. HR  117 bpm     Max Ex. BP  144/70     2 Minute Post BP  136/72       Interval HR   1 Minute HR  100     2 Minute HR  110     3 Minute HR  116     4 Minute HR  117     5 Minute HR  115     6 Minute HR  109     2 Minute Post HR  79     Interval Heart Rate?  Yes       Interval Oxygen   Interval Oxygen?  Yes     Baseline Oxygen Saturation %  96 %     1 Minute Oxygen Saturation %  88 %     1 Minute Liters of Oxygen  0 L Room Air     2 Minute Oxygen Saturation %  87 %     2 Minute Liters of Oxygen  0 L     3 Minute Oxygen Saturation %  88 %     3 Minute Liters of Oxygen  0 L     4 Minute Oxygen Saturation %  89 %     4 Minute Liters of Oxygen  0 L     5 Minute Oxygen Saturation %  87 %     5 Minute Liters of Oxygen  0 L     6 Minute Oxygen Saturation %  88 %     6 Minute Liters of Oxygen  0 L     2 Minute Post Oxygen Saturation %  94 %     2 Minute Post Liters of Oxygen  0 L       Oxygen Initial Assessment: Oxygen Initial Assessment - 03/21/19 1116      Home Oxygen   Home Oxygen Device  None    Sleep Oxygen Prescription  CPAP   just completed sleep study   Home Exercise Oxygen Prescription  None    Home at Rest Exercise Oxygen Prescription  None    Compliance with Home Oxygen Use  Yes      Initial 6 min Walk   Oxygen Used  None      Program Oxygen Prescription   Program Oxygen Prescription  None      Intervention   Short Term Goals  To learn and exhibit compliance with exercise, home and travel O2 prescription;To learn and understand  importance of monitoring SPO2 with pulse oximeter and demonstrate accurate use of the pulse oximeter.;To learn and understand importance of maintaining oxygen saturations>88%;To learn and demonstrate proper pursed lip breathing techniques or other breathing techniques.;To learn and demonstrate proper use of respiratory medications    Long  Term Goals  Exhibits compliance with exercise, home and travel O2 prescription;Verbalizes importance of monitoring SPO2 with pulse oximeter and return demonstration;Maintenance of O2 saturations>88%;Exhibits proper breathing techniques, such as pursed lip breathing or other method taught during program session;Compliance with respiratory medication;Demonstrates proper use of MDI's       Oxygen Re-Evaluation: Oxygen Re-Evaluation    Row Name 03/29/19 0823 04/17/19 1146           Program Oxygen Prescription   Program Oxygen Prescription  None  None  Home Oxygen   Home Oxygen Device  None  None      Sleep Oxygen Prescription  CPAP  CPAP      Home Exercise Oxygen Prescription  None  None      Home at Rest Exercise Oxygen Prescription  None  None      Compliance with Home Oxygen Use  Yes  Yes        Goals/Expected Outcomes   Short Term Goals  To learn and demonstrate proper pursed lip breathing techniques or other breathing techniques.;To learn and understand importance of monitoring SPO2 with pulse oximeter and demonstrate accurate use of the pulse oximeter.;To learn and understand importance of maintaining oxygen saturations>88%  To learn and demonstrate proper pursed lip breathing techniques or other breathing techniques.;To learn and understand importance of monitoring SPO2 with pulse oximeter and demonstrate accurate use of the pulse oximeter.;To learn and understand importance of maintaining oxygen saturations>88%      Long  Term Goals  Maintenance of O2 saturations>88%;Exhibits proper breathing techniques, such as pursed lip breathing or other  method taught during program session;Verbalizes importance of monitoring SPO2 with pulse oximeter and return demonstration  Maintenance of O2 saturations>88%;Exhibits proper breathing techniques, such as pursed lip breathing or other method taught during program session;Verbalizes importance of monitoring SPO2 with pulse oximeter and return demonstration      Comments  Short: Use RPE daily to regulate intensity. Long: Follow program prescription in THR.  Baker Janus is compliant with her CPAP but is still having some bad breathing days.  She works on using her PLB those days and less active. She is using her pulse oximeter at home and has noted on these days that it drops to 87-84% but she has not told the doctor yet.  She has had sleep study with desaturation but not enough for oxygen therapy. She has noted that it drops with weight bearing activity and may benefit from oxygen.  We talked about talking with her doctor about it.      Goals/Expected Outcomes  Short: Become more profiecient at using PLB.   Long: Become independent at using PLB.  Short: Contact pulmonologist about sats  Long: Continue to use PLB and managing breathing.         Oxygen Discharge (Final Oxygen Re-Evaluation): Oxygen Re-Evaluation - 04/17/19 1146      Program Oxygen Prescription   Program Oxygen Prescription  None      Home Oxygen   Home Oxygen Device  None    Sleep Oxygen Prescription  CPAP    Home Exercise Oxygen Prescription  None    Home at Rest Exercise Oxygen Prescription  None    Compliance with Home Oxygen Use  Yes      Goals/Expected Outcomes   Short Term Goals  To learn and demonstrate proper pursed lip breathing techniques or other breathing techniques.;To learn and understand importance of monitoring SPO2 with pulse oximeter and demonstrate accurate use of the pulse oximeter.;To learn and understand importance of maintaining oxygen saturations>88%    Long  Term Goals  Maintenance of O2 saturations>88%;Exhibits  proper breathing techniques, such as pursed lip breathing or other method taught during program session;Verbalizes importance of monitoring SPO2 with pulse oximeter and return demonstration    Comments  Baker Janus is compliant with her CPAP but is still having some bad breathing days.  She works on using her PLB those days and less active. She is using her pulse oximeter at home and has noted on these days  that it drops to 87-84% but she has not told the doctor yet.  She has had sleep study with desaturation but not enough for oxygen therapy. She has noted that it drops with weight bearing activity and may benefit from oxygen.  We talked about talking with her doctor about it.    Goals/Expected Outcomes  Short: Contact pulmonologist about sats  Long: Continue to use PLB and managing breathing.       Initial Exercise Prescription: Initial Exercise Prescription - 03/22/19 0900      Date of Initial Exercise RX and Referring Provider   Date  03/22/19    Referring Provider  Thad Ranger MD      Treadmill   MPH  1.7    Grade  0    Minutes  15    METs  2.3      NuStep   Level  1    SPM  80    Minutes  15    METs  2      Biostep-RELP   Level  1    SPM  50    Minutes  15    METs  2      Prescription Details   Frequency (times per week)  1    Duration  Progress to 30 minutes of continuous aerobic without signs/symptoms of physical distress      Intensity   THRR 40-80% of Max Heartrate  110-145    Ratings of Perceived Exertion  11-13    Perceived Dyspnea  0-4      Progression   Progression  Continue to progress workloads to maintain intensity without signs/symptoms of physical distress.      Resistance Training   Training Prescription  Yes    Weight  3 lbs    Reps  10-15       Perform Capillary Blood Glucose checks as needed.  Exercise Prescription Changes: Exercise Prescription Changes    Row Name 03/22/19 0900 04/02/19 1500 04/18/19 1000 05/01/19 1300 05/16/19 1200      Response to Exercise   Blood Pressure (Admit)  114/66  146/64  122/70  124/70  124/68   Blood Pressure (Exercise)  144/70  144/72  126/62  132/74  126/70   Blood Pressure (Exit)  124/70  132/80  118/58  110/66  116/62   Heart Rate (Admit)  75 bpm  71 bpm  88 bpm  70 bpm  82 bpm   Heart Rate (Exercise)  117 bpm  125 bpm  95 bpm  106 bpm  96 bpm   Heart Rate (Exit)  76 bpm  84 bpm  77 bpm  79 bpm  80 bpm   Oxygen Saturation (Admit)  96 %  --  96 %  98 %  95 %   Oxygen Saturation (Exercise)  87 %  --  94 %  93 %  93 %   Oxygen Saturation (Exit)  94 %  --  93 %  93 %  94 %   Rating of Perceived Exertion (Exercise)  _0 Perceived Dyspnea (Exercise)  _1 0   Symptoms  dizzy, back pain (6/10), SOB  --  SOB  --  none   Comments  walk test results  --  --  --  --   Duration  --  Continue with 30 min of aerobic exercise without signs/symptoms of physical distress.  Continue with 30 min of aerobic exercise without signs/symptoms of physical distress.  Progress to 30 minutes of  aerobic without signs/symptoms of physical distress  Continue with 30 min of aerobic exercise without signs/symptoms of physical distress.   Intensity  --  THRR unchanged  THRR unchanged  THRR unchanged  THRR unchanged     Progression   Progression  --  Continue to progress workloads to maintain intensity without signs/symptoms of physical distress.  Continue to progress workloads to maintain intensity without signs/symptoms of physical distress.  Continue to progress workloads to maintain intensity without signs/symptoms of physical distress.  Continue to progress workloads to maintain intensity without signs/symptoms of physical distress.   Average METs  --  2.7  2.23  2.1  2.17     Resistance Training   Training Prescription  --  Yes  Yes  Yes  Yes   Weight  --  3 lb  3 lb  3 lb  3 lb   Reps  --  10-15  10-15  10-15  10-15     Interval Training   Interval Training  --  --  No  No  No     Bike    Level  --  3  2.3  --  --   Minutes  --  15  15  --  --   METs  --  2.7  2.37  --  --     NuStep   Level  --  --  _0 SPM  --  --  --  80  --   Minutes  --  --  _1 METs  --  --  1.8  2.2  1.7     Arm Ergometer   Level  --  1  1  --  --   Minutes  --  15  15  --  --   METs  --  --  2.4  --  --     T5 Nustep   Level  --  --  --  --  3   Minutes  --  --  --  --  15   METs  --  --  --  --  2.1     Biostep-RELP   Level  --  --  1  2  --   SPM  --  --  --  50  --   Minutes  --  --  15  15  --   METs  --  --  2  2  --     Home Exercise Plan   Plans to continue exercise at  --  --  Home (comment) walking, staff videos  Home (comment) walking, staff videos  Home (comment) walking, staff videos   Frequency  --  --  Add 2 additional days to program exercise sessions.  Add 2 additional days to program exercise sessions.  Add 2 additional days to program exercise sessions.   Initial Home Exercises Provided  --  --  04/17/19  04/17/19  04/17/19   Row Name 05/29/19 1400             Response to Exercise   Blood Pressure (Admit)  128/80       Blood Pressure (Exercise)  158/64       Blood Pressure (Exit)  116/74       Heart Rate (Admit)  59 bpm  Heart Rate (Exercise)  102 bpm       Heart Rate (Exit)  74 bpm       Oxygen Saturation (Admit)  95 %       Oxygen Saturation (Exercise)  90 %       Oxygen Saturation (Exit)  94 %       Rating of Perceived Exertion (Exercise)  13       Perceived Dyspnea (Exercise)  3       Symptoms  none       Duration  Continue with 30 min of aerobic exercise without signs/symptoms of physical distress.       Intensity  THRR unchanged         Progression   Progression  Continue to progress workloads to maintain intensity without signs/symptoms of physical distress.       Average METs  2.5         Resistance Training   Training Prescription  Yes       Weight  3 lb       Reps  10-15         Interval Training   Interval Training   No         Treadmill   MPH  2.7       Grade  0       Minutes  15       METs  3.07         T5 Nustep   Level  3       SPM  80       Minutes  15       METs  2         Home Exercise Plan   Plans to continue exercise at  Home (comment) walking, staff videos       Frequency  Add 2 additional days to program exercise sessions.       Initial Home Exercises Provided  04/17/19          Exercise Comments: Exercise Comments    Row Name 03/29/19 [phone]           Exercise Comments  First full day of exercise!  Patient was oriented to gym and equipment including functions, settings, policies, and procedures.  Patient's individual exercise prescription and treatment plan were reviewed.  All starting workloads were established based on the results of the 6 minute walk test done at initial orientation visit.  The plan for exercise progression was also introduced and progression will be customized based on patient's performance and goals..          Exercise Goals and Review: Exercise Goals    Row Name 03/22/19 0904             Exercise Goals   Increase Physical Activity  Yes       Intervention  Provide advice, education, support and counseling about physical activity/exercise needs.;Develop an individualized exercise prescription for aerobic and resistive training based on initial evaluation findings, risk stratification, comorbidities and participant's personal goals.       Expected Outcomes  Short Term: Attend rehab on a regular basis to increase amount of physical activity.;Long Term: Add in home exercise to make exercise part of routine and to increase amount of physical activity.;Long Term: Exercising regularly at least 3-5 days a week.       Increase Strength and Stamina  Yes       Intervention  Provide advice, education, support and counseling  about physical activity/exercise needs.       Expected Outcomes  Short Term: Increase workloads from initial exercise prescription for  resistance, speed, and METs.;Short Term: Perform resistance training exercises routinely during rehab and add in resistance training at home;Long Term: Improve cardiorespiratory fitness, muscular endurance and strength as measured by increased METs and functional capacity (6MWT)       Able to understand and use rate of perceived exertion (RPE) scale  Yes       Intervention  Provide education and explanation on how to use RPE scale       Expected Outcomes  Short Term: Able to use RPE daily in rehab to express subjective intensity level;Long Term:  Able to use RPE to guide intensity level when exercising independently       Able to understand and use Dyspnea scale  Yes       Intervention  Provide education and explanation on how to use Dyspnea scale       Expected Outcomes  Short Term: Able to use Dyspnea scale daily in rehab to express subjective sense of shortness of breath during exertion;Long Term: Able to use Dyspnea scale to guide intensity level when exercising independently       Knowledge and understanding of Target Heart Rate Range (THRR)  Yes       Intervention  Provide education and explanation of THRR including how the numbers were predicted and where they are located for reference       Expected Outcomes  Short Term: Able to state/look up THRR;Short Term: Able to use daily as guideline for intensity in rehab;Long Term: Able to use THRR to govern intensity when exercising independently       Able to check pulse independently  Yes       Intervention  Provide education and demonstration on how to check pulse in carotid and radial arteries.;Review the importance of being able to check your own pulse for safety during independent exercise       Expected Outcomes  Short Term: Able to explain why pulse checking is important during independent exercise;Long Term: Able to check pulse independently and accurately       Understanding of Exercise Prescription  Yes       Intervention  Provide education,  explanation, and written materials on patient's individual exercise prescription       Expected Outcomes  Short Term: Able to explain program exercise prescription;Long Term: Able to explain home exercise prescription to exercise independently          Exercise Goals Re-Evaluation : Exercise Goals Re-Evaluation    Row Name 03/29/19 2229 04/17/19 1146 05/01/19 1338 05/01/19 1339 05/16/19 1218     Exercise Goal Re-Evaluation   Exercise Goals Review  Able to understand and use Dyspnea scale;Able to understand and use rate of perceived exertion (RPE) scale;Knowledge and understanding of Target Heart Rate Range (THRR);Able to check pulse independently;Understanding of Exercise Prescription  Increase Physical Activity;Increase Strength and Stamina;Understanding of Exercise Prescription  Increase Physical Activity;Increase Strength and Stamina;Able to understand and use rate of perceived exertion (RPE) scale;Able to understand and use Dyspnea scale;Knowledge and understanding of Target Heart Rate Range (THRR);Able to check pulse independently;Understanding of Exercise Prescription  --  Increase Physical Activity;Increase Strength and Stamina;Understanding of Exercise Prescription   Comments  Reviewed RPE scale, THR and program prescription with pt today.  Pt voiced understanding and was given a copy of goals to take home.  Baker Janus is off to a good start  in rehab.  She is already walking 1-2 miles a day.  --  Baker Janus is progressing well. She is at level 4 on NS and up to 2.7 mph walking.  Baker Janus is doing well in rehab.  She has now also added in pelvic floor therapy into her exericse routine.  She had a fall over the weekend and is wearing a brace on her wrist.  She up to 20 watts on the recumbent bike now.  We will continue to monitor her progress.   Expected Outcomes  Short: Use RPE daily to regulate intensity. Long: Follow program prescription in THR.  Short: Continue to add in exercise at home.  Long: Continue to  improve stamina.  --  Short : continue to progress workloads Long: increase overall stamina  Short: Return to full exercise  Long: Continue to improve stamina   Row Name 05/24/19 1154             Exercise Goal Re-Evaluation   Exercise Goals Review  Increase Physical Activity;Increase Strength and Stamina;Understanding of Exercise Prescription       Comments  Baker Janus is doing well in rehab.  She has now also added in pelvic floor therapy into her exericse routine. Pt is going to [employer] 5x/week (1.5 hours) and does weights everyday - she will rotate machines at [employer] and does resistance using the videos we have provided using 3 pound weights. Pt reports with rehab she is exercising 7x/week - encouraged pt that she is doing well, but to consider at least one rest day which could include light movement - Estill Bamberg also spoke to her during goals to address this; one day of rest doing light exercise like stretching and PT - pt reports that she feels like her breathing gets worse when she misses one day, Estill Bamberg discussed doing T5/T4 with light RPE so that her body can rest; pt reports getting enough sleep.  We will continue to monitor progress.       Expected Outcomes  Short: continue with exercise at home   Long: Continue to improve stamina          Discharge Exercise Prescription (Final Exercise Prescription Changes): Exercise Prescription Changes - 05/29/19 1400      Response to Exercise   Blood Pressure (Admit)  128/80    Blood Pressure (Exercise)  158/64    Blood Pressure (Exit)  116/74    Heart Rate (Admit)  59 bpm    Heart Rate (Exercise)  102 bpm    Heart Rate (Exit)  74 bpm    Oxygen Saturation (Admit)  95 %    Oxygen Saturation (Exercise)  90 %    Oxygen Saturation (Exit)  94 %    Rating of Perceived Exertion (Exercise)  13    Perceived Dyspnea (Exercise)  3    Symptoms  none    Duration  Continue with 30 min of aerobic exercise without signs/symptoms of physical distress.     Intensity  THRR unchanged      Progression   Progression  Continue to progress workloads to maintain intensity without signs/symptoms of physical distress.    Average METs  2.5      Resistance Training   Training Prescription  Yes    Weight  3 lb    Reps  10-15      Interval Training   Interval Training  No      Treadmill   MPH  2.7    Grade  0  Minutes  15    METs  3.07      T5 Nustep   Level  3    SPM  80    Minutes  15    METs  2      Home Exercise Plan   Plans to continue exercise at  Home (comment)   walking, staff videos   Frequency  Add 2 additional days to program exercise sessions.    Initial Home Exercises Provided  04/17/19       Nutrition:  Target Goals: Understanding of nutrition guidelines, daily intake of sodium <1531m, cholesterol <2047m calories 30% from fat and 7% or less from saturated fats, daily to have 5 or more servings of fruits and vegetables.  Education: Controlling Sodium/Reading Food Labels -Group verbal and written material supporting the discussion of sodium use in heart healthy nutrition. Review and explanation with models, verbal and written materials for utilization of the food label.   Education: General Nutrition Guidelines/Fats and Fiber: -Group instruction provided by verbal, written material, models and posters to present the general guidelines for heart healthy nutrition. Gives an explanation and review of dietary fats and fiber.   Biometrics: Pre Biometrics - 03/22/19 0905      Pre Biometrics   Height  5' 2.9" (1.598 m)    Weight  243 lb (110.2 kg)    BMI (Calculated)  43.16    Single Leg Stand  15.81 seconds        Nutrition Therapy Plan and Nutrition Goals: Nutrition Therapy & Goals - 04/05/19 1502      Nutrition Therapy   Diet  Low Na, HH diet    Protein (specify units)  90g    Fiber  25 grams    Whole Grain Foods  3 servings    Saturated Fats  12 max. grams    Fruits and Vegetables  5 servings/day     Sodium  1.5 grams      Personal Nutrition Goals   Nutrition Goal  ST: Add in more vegetables LT: pt wants to lose weight    Comments  trying to keep kcals 1500 and CHO at 150g. Uses boost protein shakes (AM with multivitamin). 2 low CHO tortillas with canadian bacon. piece of fruit. pears with low sugar syrup and cottage cheese or tu[place]andwich with white wheat. Pork skins snack or jerky (b/c low CHO). Pork chop, roasted vegetables, and corn. Sugar free jello. no sugar crystal light tea or water. Pt reports having IBS and doesn't like eggs, chicken, and a lot of vegetables. Talked about sneaking in some vegetables - ex: smoothie and spinach. Discussed HH eating, honoring hunger, and increased needs due to pumonary issues.      Intervention Plan   Intervention  Prescribe, educate and counsel regarding individualized specific dietary modifications aiming towards targeted core components such as weight, hypertension, lipid management, diabetes, heart failure and other comorbidities.;Nutrition handout(s) given to patient.    Expected Outcomes  Short Term Goal: Understand basic principles of dietary content, such as calories, fat, sodium, cholesterol and nutrients.;Short Term Goal: A plan has been developed with personal nutrition goals set during dietitian appointment.;Long Term Goal: Adherence to prescribed nutrition plan.       Nutrition Assessments: Nutrition Assessments - 03/22/19 0905      MEDFICTS Scores   Pre Score  62       MEDIFICTS Score Key:          ?70 Need to make dietary changes  40-70 Heart Healthy Diet         ? 40 Therapeutic Level Cholesterol Diet  Nutrition Goals Re-Evaluation: Nutrition Goals Re-Evaluation    [place] Name 05/08/19 1143 05/24/19 1201           Goals   Nutrition Goal  ST: Add in more vegetables LT: pt wants to lose weight  ST: Add in more vegetables LT: pt wants to lose weight      Comment  Pt reports being able to add 1 extra vegetable per  meal. Pt reports that she found a meal app that uses healthy recipes and gives you a grocery list for them as well. Pt reports this has helped her eat less fried foods.  Pt reports doing well and is using a ninja grill - cooking meat and vegetables in more HH ways. Getting a variety of colors. Pt reports doing well and would like to continue with what she is currently doing.      Expected Outcome  ST: Add in more vegetables LT: pt wants to lose weight  ST: Add in more vegetables LT: pt wants to lose weight         Nutrition Goals Discharge (Final Nutrition Goals Re-Evaluation): Nutrition Goals Re-Evaluation - 05/24/19 1201      Goals   Nutrition Goal  ST: Add in more vegetables LT: pt wants to lose weight    Comment  Pt reports doing well and is using a ninja grill - cooking meat and vegetables in more HH ways. Getting a variety of colors. Pt reports doing well and would like to continue with what she is currently doing.    Expected Outcome  ST: Add in more vegetables LT: pt wants to lose weight       Psychosocial: Target Goals: Acknowledge presence or absence of significant depression and/or stress, maximize coping skills, provide positive support system. Participant is able to verbalize types and ability to use techniques and skills needed for reducing stress and depression.   Education: Depression - Provides group verbal and written instruction on the correlation between heart/lung disease and depressed mood, treatment options, and the stigmas associated with seeking treatment.   Education: Sleep Hygiene -Provides group verbal and written instruction about how sleep can affect your health.  Define sleep hygiene, discuss sleep cycles and impact of sleep habits. Review good sleep hygiene tips.    Education: Stress and Anxiety: - Provides group verbal and written instruction about the health risks of elevated stress and causes of high stress.  Discuss the correlation between heart/lung  disease and anxiety and treatment options. Review healthy ways to manage with stress and anxiety.   Initial Review & Psychosocial Screening: Initial Psych Review & Screening - 03/21/19 1118      Initial Review   Current issues with  Current Depression;Current Anxiety/Panic;Current Stress Concerns    Source of Stress Concerns  Unable to participate in former interests or hobbies;Unable to perform yard/household activities;Retirement/disability;Financial;Chronic Illness    Comments  Currently applying for disability (first round was denied), has tried Xanax before, but not currently on it, anti-depressive make suicidal ideations worse, bowel and bladder incontenince (vaginal fault) and has appt with [facility]?  Yes   six children (three are local)   Comments  Three of her six children live in the area and has a granddaughter that lives with her to help out.      Barriers  Psychosocial barriers to participate in program  The patient should benefit from training in stress management and relaxation.;There are no identifiable barriers or psychosocial needs.      Screening Interventions   Interventions  Encouraged to exercise;Provide feedback about the scores to participant;To provide support and resources with identified psychosocial needs    Expected Outcomes  Long Term Goal: Stressors or current issues are controlled or eliminated.;Short Term goal: Identification and review with participant of any Quality of Life or Depression concerns found by scoring the questionnaire.;Long Term goal: The participant improves quality of Life and PHQ9 Scores as seen by post scores and/or verbalization of changes       Quality of Life Scores:  Scores of 19 and below usually indicate a poorer quality of life in these areas.  A difference of  2-3 points is a clinically meaningful difference.  A difference of 2-3 points in the total score of the Quality of Life Index has  been associated with significant improvement in overall quality of life, self-image, physical symptoms, and general health in studies assessing change in quality of life.  PHQ-9: Recent Review Flowsheet Data    Depression screen [facility] 2/9 05/03/2019 04/16/2019 03/22/2019   Decreased Interest _0 Down, Depressed, Hopeless _1 PHQ - 2 Score _2 Altered sleeping _3 Tired, decreased energy _4 Change in appetite 2 0 3   Feeling bad or failure about yourself  _5 Trouble concentrating _6 Moving slowly or fidgety/restless 2 0 0   Suicidal thoughts 0 0 0   PHQ-9 Score _7 Difficult doing work/chores Somewhat difficult Somewhat difficult Very difficult     Interpretation of Total Score  Total Score Depression Severity:  1-4 = Minimal depression, 5-9 = Mild depression, 10-14 = Moderate depression, 15-19 = Moderately severe depression, 20-27 = Severe depression   Psychosocial Evaluation and Intervention: Psychosocial Evaluation - 03/21/19 1129      Psychosocial Evaluation & Interventions   Interventions  Stress management education;Encouraged to exercise with the program and follow exercise prescription    Comments  Baker Janus is coming to pulmonary rehab.  She is coming in for COPD and had a recent hospitalization for an exacerbation and hypoxia.  She has a significant history of depression and anxiety.  She feels that her depression is currently under control but her anxiety is starting to spiral.  In the past she was on xanax for anxiety and it worked and helped her sleep, but she was taken off of it.  She does not do well on anti-depression meds as they seem to make her suicidal ideations worse.  She is currently thinking about seeing someone at the [facility] for help with her anxiety.  She is also hoping that the exercise will help as well.  She is looking forwad to the program.  She wants to learn to be comfortable with exercise and build her confidence back up.   She doesn't want to continue to worry about her heart and sats dropping when she exercises.  She will benefit from exercise from the strength and stamina side as well.  She also has issues with bowel and urinary incontenince that ofter leads to infections.  She is planning to see her OB-GYN about this and is considering pelvic floor therapy as well.    Expected Outcomes  Short: Attend  rehab regularly to build confidence in her ability to exercise.  Long: Better management of anxiety.    Continue Psychosocial Services   Follow up required by staff       Psychosocial Re-Evaluation: Psychosocial Re-Evaluation    [place] Name 04/16/19 [phone] 04/17/19 1153 05/24/19 1151         Psychosocial Re-Evaluation   Current issues with  Current Depression  Current Depression  Current Depression     Comments  PHQ 9 repeated  Score decreased 17 to 7 today.  Baker Janus is doing better now that she is in rehab.  She is still getting frustrated with sats dropping at home, but we made a plan to start to talk to the doctor about it.  We also reviewed  COPD action plan to help manage breathing. Her depression is getting better overall.  Pt taking effexor 37.5 dose. Pt reports feeling better now on the new medication, but is still frustrated about her breathing and not having an O2 tank. Support system is children- Curator and mom live with her.     Expected Outcomes  STG continues decrease in PHQ 9 score as attends program and works on healing. LTG: Phq9 in normal range  Short: Continue to attend rehab and talk to doctor.  Long: Work on positive talk and self care.  Short: Continue to attend rehab and talk to doctor.  Long: Work on positive talk and self care.     Interventions  Encouraged to attend Pulmonary Rehabilitation for the exercise  Encouraged to attend Pulmonary Rehabilitation for the exercise;Stress management education;Relaxation education  Encouraged to attend Pulmonary Rehabilitation for the exercise;Stress  management education;Relaxation education     Continue Psychosocial Services   Follow up required by staff  Follow up required by staff  Follow up required by staff        Psychosocial Discharge (Final Psychosocial Re-Evaluation): Psychosocial Re-Evaluation - 05/24/19 1151      Psychosocial Re-Evaluation   Current issues with  Current Depression    Comments  Pt taking effexor 37.5 dose. Pt reports feeling better now on the new medication, but is still frustrated about her breathing and not having an O2 tank. Support system is children- Curator and mom live with her.    Expected Outcomes  Short: Continue to attend rehab and talk to doctor.  Long: Work on positive talk and self care.    Interventions  Encouraged to attend Pulmonary Rehabilitation for the exercise;Stress management education;Relaxation education    Continue Psychosocial Services   Follow up required by staff       Education: Education Goals: Education classes will be provided on a weekly basis, covering required topics. Participant will state understanding/return demonstration of topics presented.  Learning Barriers/Preferences: Learning Barriers/Preferences - 03/21/19 1113      Learning Barriers/Preferences   Learning Barriers  Reading;Sight   occasional "brain fog", glasses   Learning Preferences  Written Material       General Pulmonary Education Topics:  Infection Prevention: - Provides verbal and written material to individual with discussion of infection control including proper hand washing and proper equipment cleaning during exercise session.   Pulmonary Rehab from 03/22/2019 in [facility] Cardiac and Pulmonary Rehab  Date  03/22/19  Educator  [facility]  Instruction Review Code  1- Verbalizes Understanding      Falls Prevention: - Provides verbal and written material to individual with discussion of falls prevention and safety.   Pulmonary Rehab from 03/22/2019 in [facility] Cardiac and Pulmonary Rehab  Date   03/22/19  Educator  [facility]  Instruction Review Code  1- Verbalizes Understanding      Chronic Lung Diseases: - Group verbal and written instruction to review updates, respiratory medications, advancements in procedures and treatments. Discuss use of supplemental oxygen including available portable oxygen systems, continuous and intermittent flow rates, concentrators, personal use and safety guidelines. Review proper use of inhaler and spacers. Provide informative websites for self-education.    Energy Conservation: - Provide group verbal and written instruction for methods to conserve energy, plan and organize activities. Instruct on pacing techniques, use of adaptive equipment and posture/positioning to relieve shortness of breath.   Triggers and Exacerbations: - Group verbal and written instruction to review types of environmental triggers and ways to prevent exacerbations. Discuss weather changes, air quality and the benefits of nasal washing. Review warning signs and symptoms to help prevent infections. Discuss techniques for effective airway clearance, coughing, and vibrations.   AED/CPR: - Group verbal and written instruction with the use of models to demonstrate the basic use of the AED with the basic ABC's of resuscitation.   Anatomy and Physiology of the Lungs: - Group verbal and written instruction with the use of models to provide basic lung anatomy and physiology related to function, structure and complications of lung disease.   Anatomy & Physiology of the Heart: - Group verbal and written instruction and models provide basic cardiac anatomy and physiology, with the coronary electrical and arterial systems. Review of Valvular disease and Heart Failure   Cardiac Medications: - Group verbal and written instruction to review commonly prescribed medications for heart disease. Reviews the medication, class of the drug, and side effects.   Other: -Provides group and verbal  instruction on various topics (see comments)   Knowledge Questionnaire Score: Knowledge Questionnaire Score - 03/22/19 0906      Knowledge Questionnaire Score   Pre Score  16/18 Education Focus: Oxygen Safety        Core Components/Risk Factors/Patient Goals at Admission: Personal Goals and Risk Factors at Admission - 03/22/19 0906      Core Components/Risk Factors/Patient Goals on Admission    Weight Management  Yes;Obesity;Weight Loss    Intervention  Weight Management: Develop a combined nutrition and exercise program designed to reach desired caloric intake, while maintaining appropriate intake of nutrient and fiber, sodium and fats, and appropriate energy expenditure required for the weight goal.;Weight Management: Provide education and appropriate resources to help participant work on and attain dietary goals.;Weight Management/Obesity: Establish reasonable short term and long term weight goals.;Obesity: Provide education and appropriate resources to help participant work on and attain dietary goals.    Admit Weight  230 lb (104.3 kg)    Goal Weight: Short Term  225 lb (102.1 kg)    Goal Weight: Long Term  220 lb (99.8 kg)    Expected Outcomes  Short Term: Continue to assess and modify interventions until short term weight is achieved;Long Term: Adherence to nutrition and physical activity/exercise program aimed toward attainment of established weight goal;Weight Loss: Understanding of general recommendations for a balanced deficit meal plan, which promotes 1-2 lb weight loss per week and includes a negative energy balance of [id number] kcal/d;Understanding recommendations for meals to include 15-35% energy as protein, 25-35% energy from fat, 35-60% energy from carbohydrates, less than 257m of dietary cholesterol, 20-35 gm of total fiber daily;Understanding of distribution of calorie intake throughout the day with the consumption of 4-5 meals/snacks    Tobacco Cessation  Yes  Intervention  Advice worker, assist with locating and accessing local/national Quit Smoking programs, and support quit date choice.    Expected Outcomes  Short Term: Will quit all tobacco product use, adhering to prevention of relapse plan.;Long Term: Complete abstinence from all tobacco products for at least 12 months from quit date.    Improve shortness of breath with ADL's  Yes    Intervention  Provide education, individualized exercise plan and daily activity instruction to help decrease symptoms of SOB with activities of daily living.    Expected Outcomes  Short Term: Improve cardiorespiratory fitness to achieve a reduction of symptoms when performing ADLs;Long Term: Be able to perform more ADLs without symptoms or delay the onset of symptoms    Heart Failure  Yes    Intervention  Provide a combined exercise and nutrition program that is supplemented with education, support and counseling about heart failure. Directed toward relieving symptoms such as shortness of breath, decreased exercise tolerance, and extremity edema.    Expected Outcomes  Improve functional capacity of life;Short term: Attendance in program 2-3 days a week with increased exercise capacity. Reported lower sodium intake. Reported increased fruit and vegetable intake. Reports medication compliance.;Short term: Daily weights obtained and reported for increase. Utilizing diuretic protocols set by physician.;Long term: Adoption of self-care skills and reduction of barriers for early signs and symptoms recognition and intervention leading to self-care maintenance.       Education:Diabetes - Individual verbal and written instruction to review signs/symptoms of diabetes, desired ranges of glucose level fasting, after meals and with exercise. Acknowledge that pre and post exercise glucose checks will be done for 3 sessions at entry of program.   Education: Know Your Numbers and Risk Factors: -Group verbal and written  instruction about important numbers in your health.  Discussion of what are risk factors and how they play a role in the disease process.  Review of Cholesterol, Blood Pressure, Diabetes, and BMI and the role they play in your overall health.   Core Components/Risk Factors/Patient Goals Review:  Goals and Risk Factor Review    [place] Name 04/17/19 1155 05/24/19 1138           Core Components/Risk Factors/Patient Goals Review   Personal Goals Review  Weight Management/Obesity;Tobacco Cessation;Improve shortness of breath with ADL's;Hypertension  Weight Management/Obesity;Tobacco Cessation;Improve shortness of breath with ADL's;Hypertension      Review  Her weight was up recently, but she is trying to get it back down.  She has already started to take steps to improve it again and keep moving.  She is still doing well staying away from cigarettes.  Breathing is improving. and Pressures have been good.  Pt reports that she is still trying to get an O2 tank for exercise. She reports her O2 saturation drops below 90 and were 86 for 5 minutes of a 30 minute workout at her gym. She recently fell off of a treadmil at the gym when she forgot to put the safety on, pt reports that she is fine but hurt her left hand. Pt reports SOB has been the same and is waiting on her doctor to get back to her about the O2 tank. Pressures have been good. Pt reports doing very well with smoking cessation and reports "I can't believe I ever smoked".      Expected Outcomes  Short: Work on weight loss.  Long: Continue monitor risk factors.  ST: O2 tank, continue with work outside of the gym  LT: Continue  monitor risk factors.         Core Components/Risk Factors/Patient Goals at Discharge (Final Review):  Goals and Risk Factor Review - 05/24/19 1138      Core Components/Risk Factors/Patient Goals Review   Personal Goals Review  Weight Management/Obesity;Tobacco Cessation;Improve shortness of breath with ADL's;Hypertension     Review  Pt reports that she is still trying to get an O2 tank for exercise. She reports her O2 saturation drops below 90 and were 86 for 5 minutes of a 30 minute workout at her gym. She recently fell off of a treadmil at the gym when she forgot to put the safety on, pt reports that she is fine but hurt her left hand. Pt reports SOB has been the same and is waiting on her doctor to get back to her about the O2 tank. Pressures have been good. Pt reports doing very well with smoking cessation and reports "I can't believe I ever smoked".    Expected Outcomes  ST: O2 tank, continue with work outside of the gym  LT: Continue monitor risk factors.       ITP Comments: ITP Comments    Row Name 03/21/19 1140 03/22/19 0857 03/22/19 0858 03/29/19 0818 04/04/19 1429   ITP Comments  Completed virtual orientation today.  EP eval scheduled for 1/14 at 8am.  Documentation for diagnosis can be in [facility] encounter 01/03/19 and 02/28/19.  Completed 6MWT and gym orientation.  Initial ITP created and sent for review to Dr. Emily Filbert, Medical Director.  Kahlee has recently quit tobacco use within the last 6 months. Intervention for relapse prevention was provided at the initial medical review. She was encouraged to continue to with tobacco cessation and was provided information on relapse prevention. Patient received information about combination therapy, tobacco cessation classes, quit line, and quit smoking apps in case of a relapse. Patient demonstrated understanding of this material.Staff will continue to provide encouragement and follow up with the patient throughout the program.  First full day of exercise!  Patient was oriented to gym and equipment including functions, settings, policies, and procedures.  Patient's individual exercise prescription and treatment plan were reviewed.  All starting workloads were established based on the results of the 6 minute walk test done at initial orientation visit.  The plan for exercise  progression was also introduced and progression will be customized based on patient's performance and goals..  30 day review completed. ITP sent to Dr. Emily Filbert, Medical Director of Cardiac and Pulmonary Rehab. Continue with ITP unless changes are made by physician.  Department operating under reduced schedule until further notice by request from hospital leadership.  New to program oriented 03/22/19.   [place] Name 04/05/19 1522 04/16/19 0842 05/02/19 0643 05/30/19 0652 06/07/19 1629   ITP Comments  Completed Initial RD Eval  PHQ 9 repeated  Score decreased 17 to 7 today.  30 day chart review completed. ITP sent to Dr Zachery Dakins Medical Director, for review,changes as needed and signature.  30 day chart review completed. ITP sent to Dr Zachery Dakins Medical Director, for review,changes as needed and signature. Continue with ITP if no changes requested  Baker Janus will be out the next three weeks and will be out of town.   [place] Name 06/15/19 1305 06/27/19 1047         ITP Comments  Baker Janus is still out of town.  30 day chart review completed. ITP sent to Dr Zachery Dakins Medical Director, for review,changes as needed and signature.  Continue with ITP if no changes requested         Comments:

## 2019-06-28 ENCOUNTER — Ambulatory Visit: Payer: Medicaid Other

## 2019-07-03 ENCOUNTER — Encounter: Payer: Medicaid Other | Attending: Pulmonary Disease

## 2019-07-03 DIAGNOSIS — J449 Chronic obstructive pulmonary disease, unspecified: Secondary | ICD-10-CM | POA: Insufficient documentation

## 2019-07-05 ENCOUNTER — Encounter: Payer: Self-pay | Admitting: *Deleted

## 2019-07-05 ENCOUNTER — Telehealth: Payer: Self-pay | Admitting: *Deleted

## 2019-07-05 DIAGNOSIS — J449 Chronic obstructive pulmonary disease, unspecified: Secondary | ICD-10-CM

## 2019-07-05 NOTE — Telephone Encounter (Signed)
Called to check on pt.  Last check in she was going to be out for three weeks due to a family circumstance.  She has continued to be out now for 4 weeks. Left message.

## 2019-07-10 ENCOUNTER — Encounter: Payer: Medicaid Other | Attending: Pulmonary Disease

## 2019-07-10 DIAGNOSIS — J449 Chronic obstructive pulmonary disease, unspecified: Secondary | ICD-10-CM | POA: Insufficient documentation

## 2019-07-12 ENCOUNTER — Telehealth: Payer: Self-pay

## 2019-07-12 NOTE — Telephone Encounter (Signed)
Called Terri Hughes to check in as we haven't seen her at rehab or heard from her. LM that if we did not hear from her or see her in rehab in the next couple of weeks we would have to discharge her at that time.

## 2019-07-25 ENCOUNTER — Encounter: Payer: Self-pay | Admitting: *Deleted

## 2019-07-25 DIAGNOSIS — J449 Chronic obstructive pulmonary disease, unspecified: Secondary | ICD-10-CM

## 2019-07-25 NOTE — Progress Notes (Signed)
Pulmonary Individual Treatment Plan  Patient Details  Name: Terri Hughes MRN: [id number] Date of Birth: 15-Mar-1960 Referring Provider:     Pulmonary Rehab from 03/22/2019 in [facility] Cardiac and Pulmonary Rehab  Referring Provider  Thad Ranger MD      Initial Encounter Date:    Pulmonary Rehab from 03/22/2019 in [facility] Cardiac and Pulmonary Rehab  Date  03/22/19      Visit Diagnosis: Chronic obstructive pulmonary disease, unspecified COPD type ([place])  Patient's Home Medications on Admission:  Current Outpatient Medications:  .  albuterol (ACCUNEB) 0.63 MG/3ML nebulizer solution, Take 1 ampule by nebulization every 6 (six) hours as needed for wheezing., Disp: , Rfl:  .  albuterol (VENTOLIN HFA) 108 (90 Base) MCG/ACT inhaler, Inhale 1-2 puffs into the lungs every 4 (four) hours as needed for wheezing or shortness of breath., Disp: , Rfl:  .  budesonide-formoterol (SYMBICORT) 160-4.5 MCG/ACT inhaler, Inhale 2 puffs into the lungs daily. , Disp: , Rfl:  .  montelukast (SINGULAIR) 10 MG tablet, Take 10 mg by mouth at bedtime. , Disp: , Rfl:  .  omeprazole (PRILOSEC) 20 MG capsule, Take 20 mg by mouth daily. , Disp: , Rfl:  .  tiotropium (SPIRIVA) 18 MCG inhalation capsule, Place into inhaler and inhale., Disp: , Rfl:  .  venlafaxine (EFFEXOR) 37.5 MG tablet, Take 37.5 mg by mouth daily., Disp: , Rfl:   Past Medical History: Past Medical History:  Diagnosis Date  . Asthma   . CHF (congestive heart failure) (HCC)     Tobacco Use: Social History   Tobacco Use  Smoking Status Former Smoker  . Packs/day: 3.00  . Years: 47.00  . Pack years: 141.00  . Types: Cigarettes  . Start date: 04/18/1971  . Quit date: 01/02/2019  . Years since quitting: 0.5  Smokeless Tobacco Never Used    Labs: Recent Review Flowsheet Data    There is no flowsheet data to display.       Pulmonary Assessment Scores: Pulmonary Assessment Scores    Row Name 03/22/19 0907         ADL UCSD   ADL  Phase  Entry     SOB Score total  86     Rest  0     Walk  3     Stairs  5     Bath  4     Dress  4     Shop  5       CAT Score   CAT Score  28       mMRC Score   mMRC Score  4        UCSD: Self-administered rating of dyspnea associated with activities of daily living (ADLs) 6-point scale (0 = "not at all" to 5 = "maximal or unable to do because of breathlessness")  Scoring Scores range from 0 to 120.  Minimally important difference is 5 units  CAT: CAT can identify the health impairment of COPD patients and is better correlated with disease progression.  CAT has a scoring range of zero to 40. The CAT score is classified into four groups of low (less than 10), medium (10 - 20), high (21-30) and very high (31-40) based on the impact level of disease on health status. A CAT score over 10 suggests significant symptoms.  A worsening CAT score could be explained by an exacerbation, poor medication adherence, poor inhaler technique, or progression of COPD or comorbid conditions.  CAT MCID is 2 points  mMRC: mMRC (Modified Medical Research Council) Dyspnea Scale is used to assess the degree of baseline functional disability in patients of respiratory disease due to dyspnea. No minimal important difference is established. A decrease in score of 1 point or greater is considered a positive change.   Pulmonary Function Assessment: Pulmonary Function Assessment - 03/22/19 0927      Initial Spirometry Results   FVC%  77 %   02/07/2019   FEV1%  53 %    FEV1/FVC Ratio  55      Post Bronchodilator Spirometry Results   FVC%  81 %    FEV1%  58 %    FEV1/FVC Ratio  57      Breath   Shortness of Breath  Yes;Fear of Shortness of Breath;Limiting activity;Panic with Shortness of Breath       Exercise Target Goals: Exercise Program Goal: Individual exercise prescription set using results from initial 6 min walk test and THRR while considering  patient's activity barriers and safety.    Exercise Prescription Goal: Initial exercise prescription builds to 30-45 minutes a day of aerobic activity, 2-3 days per week.  Home exercise guidelines will be given to patient during program as part of exercise prescription that the participant will acknowledge.  Education: Aerobic Exercise & Resistance Training: - Gives group verbal and written instruction on the various components of exercise. Focuses on aerobic and resistive training programs and the benefits of this training and how to safely progress through these programs..   Education: Exercise & Equipment Safety: - Individual verbal instruction and demonstration of equipment use and safety with use of the equipment.   Pulmonary Rehab from 03/22/2019 in [facility] Cardiac and Pulmonary Rehab  Date  03/22/19  Educator  [facility]  Instruction Review Code  1- Verbalizes Understanding      Education: Exercise Physiology & General Exercise Guidelines: - Group verbal and written instruction with models to review the exercise physiology of the cardiovascular system and associated critical values. Provides general exercise guidelines with specific guidelines to those with heart or lung disease.    Education: Flexibility, Balance, Mind/Body Relaxation: Provides group verbal/written instruction on the benefits of flexibility and balance training, including mind/body exercise modes such as yoga, pilates and tai chi.  Demonstration and skill practice provided.   Activity Barriers & Risk Stratification: Activity Barriers & Cardiac Risk Stratification - 03/21/19 1111      Activity Barriers & Cardiac Risk Stratification   Activity Barriers  Joint Problems;Other (comment);Deconditioning;Muscular Weakness;Shortness of Breath    Comments  occasional bilateral foot cramps on top of foot after being on feet for prolonged period of time (uses [employer] to treat pain)       6 Minute Walk: 6 Minute Walk    Row Name 03/22/19 0858         6 Minute  Walk   Phase  Initial     Distance  1200 feet     Walk Time  6 minutes     # of Rest Breaks  0     MPH  2.27     METS  2.33     RPE  15     Perceived Dyspnea   4     VO2 Peak  8.14     Symptoms  Yes (comment)     Comments  dizzy (improved with rest), back pain 6/10, SOB     Resting HR  75 bpm     Resting BP  114/66  Resting Oxygen Saturation   96 %     Exercise Oxygen Saturation  during 6 min walk  87 %     Max Ex. HR  117 bpm     Max Ex. BP  144/70     2 Minute Post BP  136/72       Interval HR   1 Minute HR  100     2 Minute HR  110     3 Minute HR  116     4 Minute HR  117     5 Minute HR  115     6 Minute HR  109     2 Minute Post HR  79     Interval Heart Rate?  Yes       Interval Oxygen   Interval Oxygen?  Yes     Baseline Oxygen Saturation %  96 %     1 Minute Oxygen Saturation %  88 %     1 Minute Liters of Oxygen  0 L Room Air     2 Minute Oxygen Saturation %  87 %     2 Minute Liters of Oxygen  0 L     3 Minute Oxygen Saturation %  88 %     3 Minute Liters of Oxygen  0 L     4 Minute Oxygen Saturation %  89 %     4 Minute Liters of Oxygen  0 L     5 Minute Oxygen Saturation %  87 %     5 Minute Liters of Oxygen  0 L     6 Minute Oxygen Saturation %  88 %     6 Minute Liters of Oxygen  0 L     2 Minute Post Oxygen Saturation %  94 %     2 Minute Post Liters of Oxygen  0 L       Oxygen Initial Assessment: Oxygen Initial Assessment - 03/21/19 1116      Home Oxygen   Home Oxygen Device  None    Sleep Oxygen Prescription  CPAP   just completed sleep study   Home Exercise Oxygen Prescription  None    Home at Rest Exercise Oxygen Prescription  None    Compliance with Home Oxygen Use  Yes      Initial 6 min Walk   Oxygen Used  None      Program Oxygen Prescription   Program Oxygen Prescription  None      Intervention   Short Term Goals  To learn and exhibit compliance with exercise, home and travel O2 prescription;To learn and understand  importance of monitoring SPO2 with pulse oximeter and demonstrate accurate use of the pulse oximeter.;To learn and understand importance of maintaining oxygen saturations>88%;To learn and demonstrate proper pursed lip breathing techniques or other breathing techniques.;To learn and demonstrate proper use of respiratory medications    Long  Term Goals  Exhibits compliance with exercise, home and travel O2 prescription;Verbalizes importance of monitoring SPO2 with pulse oximeter and return demonstration;Maintenance of O2 saturations>88%;Exhibits proper breathing techniques, such as pursed lip breathing or other method taught during program session;Compliance with respiratory medication;Demonstrates proper use of MDI's       Oxygen Re-Evaluation: Oxygen Re-Evaluation    Row Name 03/29/19 0823 04/17/19 1146           Program Oxygen Prescription   Program Oxygen Prescription  None  None  Home Oxygen   Home Oxygen Device  None  None      Sleep Oxygen Prescription  CPAP  CPAP      Home Exercise Oxygen Prescription  None  None      Home at Rest Exercise Oxygen Prescription  None  None      Compliance with Home Oxygen Use  Yes  Yes        Goals/Expected Outcomes   Short Term Goals  To learn and demonstrate proper pursed lip breathing techniques or other breathing techniques.;To learn and understand importance of monitoring SPO2 with pulse oximeter and demonstrate accurate use of the pulse oximeter.;To learn and understand importance of maintaining oxygen saturations>88%  To learn and demonstrate proper pursed lip breathing techniques or other breathing techniques.;To learn and understand importance of monitoring SPO2 with pulse oximeter and demonstrate accurate use of the pulse oximeter.;To learn and understand importance of maintaining oxygen saturations>88%      Long  Term Goals  Maintenance of O2 saturations>88%;Exhibits proper breathing techniques, such as pursed lip breathing or other  method taught during program session;Verbalizes importance of monitoring SPO2 with pulse oximeter and return demonstration  Maintenance of O2 saturations>88%;Exhibits proper breathing techniques, such as pursed lip breathing or other method taught during program session;Verbalizes importance of monitoring SPO2 with pulse oximeter and return demonstration      Comments  Short: Use RPE daily to regulate intensity. Long: Follow program prescription in THR.  Terri Hughes is compliant with her CPAP but is still having some bad breathing days.  She works on using her PLB those days and less active. She is using her pulse oximeter at home and has noted on these days that it drops to 87-84% but she has not told the doctor yet.  She has had sleep study with desaturation but not enough for oxygen therapy. She has noted that it drops with weight bearing activity and may benefit from oxygen.  We talked about talking with her doctor about it.      Goals/Expected Outcomes  Short: Become more profiecient at using PLB.   Long: Become independent at using PLB.  Short: Contact pulmonologist about sats  Long: Continue to use PLB and managing breathing.         Oxygen Discharge (Final Oxygen Re-Evaluation): Oxygen Re-Evaluation - 04/17/19 1146      Program Oxygen Prescription   Program Oxygen Prescription  None      Home Oxygen   Home Oxygen Device  None    Sleep Oxygen Prescription  CPAP    Home Exercise Oxygen Prescription  None    Home at Rest Exercise Oxygen Prescription  None    Compliance with Home Oxygen Use  Yes      Goals/Expected Outcomes   Short Term Goals  To learn and demonstrate proper pursed lip breathing techniques or other breathing techniques.;To learn and understand importance of monitoring SPO2 with pulse oximeter and demonstrate accurate use of the pulse oximeter.;To learn and understand importance of maintaining oxygen saturations>88%    Long  Term Goals  Maintenance of O2 saturations>88%;Exhibits  proper breathing techniques, such as pursed lip breathing or other method taught during program session;Verbalizes importance of monitoring SPO2 with pulse oximeter and return demonstration    Comments  Terri Hughes is compliant with her CPAP but is still having some bad breathing days.  She works on using her PLB those days and less active. She is using her pulse oximeter at home and has noted on these days  that it drops to 87-84% but she has not told the doctor yet.  She has had sleep study with desaturation but not enough for oxygen therapy. She has noted that it drops with weight bearing activity and may benefit from oxygen.  We talked about talking with her doctor about it.    Goals/Expected Outcomes  Short: Contact pulmonologist about sats  Long: Continue to use PLB and managing breathing.       Initial Exercise Prescription: Initial Exercise Prescription - 03/22/19 0900      Date of Initial Exercise RX and Referring Provider   Date  03/22/19    Referring Provider  Thad Ranger MD      Treadmill   MPH  1.7    Grade  0    Minutes  15    METs  2.3      NuStep   Level  1    SPM  80    Minutes  15    METs  2      Biostep-RELP   Level  1    SPM  50    Minutes  15    METs  2      Prescription Details   Frequency (times per week)  1    Duration  Progress to 30 minutes of continuous aerobic without signs/symptoms of physical distress      Intensity   THRR 40-80% of Max Heartrate  110-145    Ratings of Perceived Exertion  11-13    Perceived Dyspnea  0-4      Progression   Progression  Continue to progress workloads to maintain intensity without signs/symptoms of physical distress.      Resistance Training   Training Prescription  Yes    Weight  3 lbs    Reps  10-15       Perform Capillary Blood Glucose checks as needed.  Exercise Prescription Changes: Exercise Prescription Changes    Row Name 03/22/19 0900 04/02/19 1500 04/18/19 1000 05/01/19 1300 05/16/19 1200      Response to Exercise   Blood Pressure (Admit)  114/66  146/64  122/70  124/70  124/68   Blood Pressure (Exercise)  144/70  144/72  126/62  132/74  126/70   Blood Pressure (Exit)  124/70  132/80  118/58  110/66  116/62   Heart Rate (Admit)  75 bpm  71 bpm  88 bpm  70 bpm  82 bpm   Heart Rate (Exercise)  117 bpm  125 bpm  95 bpm  106 bpm  96 bpm   Heart Rate (Exit)  76 bpm  84 bpm  77 bpm  79 bpm  80 bpm   Oxygen Saturation (Admit)  96 %  --  96 %  98 %  95 %   Oxygen Saturation (Exercise)  87 %  --  94 %  93 %  93 %   Oxygen Saturation (Exit)  94 %  --  93 %  93 %  94 %   Rating of Perceived Exertion (Exercise)  _0 Perceived Dyspnea (Exercise)  _1 0   Symptoms  dizzy, back pain (6/10), SOB  --  SOB  --  none   Comments  walk test results  --  --  --  --   Duration  --  Continue with 30 min of aerobic exercise without signs/symptoms of physical distress.  Continue with 30 min of aerobic exercise without signs/symptoms of physical distress.  Progress to 30 minutes of  aerobic without signs/symptoms of physical distress  Continue with 30 min of aerobic exercise without signs/symptoms of physical distress.   Intensity  --  THRR unchanged  THRR unchanged  THRR unchanged  THRR unchanged     Progression   Progression  --  Continue to progress workloads to maintain intensity without signs/symptoms of physical distress.  Continue to progress workloads to maintain intensity without signs/symptoms of physical distress.  Continue to progress workloads to maintain intensity without signs/symptoms of physical distress.  Continue to progress workloads to maintain intensity without signs/symptoms of physical distress.   Average METs  --  2.7  2.23  2.1  2.17     Resistance Training   Training Prescription  --  Yes  Yes  Yes  Yes   Weight  --  3 lb  3 lb  3 lb  3 lb   Reps  --  10-15  10-15  10-15  10-15     Interval Training   Interval Training  --  --  No  No  No     Bike    Level  --  3  2.3  --  --   Minutes  --  15  15  --  --   METs  --  2.7  2.37  --  --     NuStep   Level  --  --  _0 SPM  --  --  --  80  --   Minutes  --  --  _1 METs  --  --  1.8  2.2  1.7     Arm Ergometer   Level  --  1  1  --  --   Minutes  --  15  15  --  --   METs  --  --  2.4  --  --     T5 Nustep   Level  --  --  --  --  3   Minutes  --  --  --  --  15   METs  --  --  --  --  2.1     Biostep-RELP   Level  --  --  1  2  --   SPM  --  --  --  50  --   Minutes  --  --  15  15  --   METs  --  --  2  2  --     Home Exercise Plan   Plans to continue exercise at  --  --  Home (comment) walking, staff videos  Home (comment) walking, staff videos  Home (comment) walking, staff videos   Frequency  --  --  Add 2 additional days to program exercise sessions.  Add 2 additional days to program exercise sessions.  Add 2 additional days to program exercise sessions.   Initial Home Exercises Provided  --  --  04/17/19  04/17/19  04/17/19   Row Name 05/29/19 1400             Response to Exercise   Blood Pressure (Admit)  128/80       Blood Pressure (Exercise)  158/64       Blood Pressure (Exit)  116/74       Heart Rate (Admit)  59 bpm  Heart Rate (Exercise)  102 bpm       Heart Rate (Exit)  74 bpm       Oxygen Saturation (Admit)  95 %       Oxygen Saturation (Exercise)  90 %       Oxygen Saturation (Exit)  94 %       Rating of Perceived Exertion (Exercise)  13       Perceived Dyspnea (Exercise)  3       Symptoms  none       Duration  Continue with 30 min of aerobic exercise without signs/symptoms of physical distress.       Intensity  THRR unchanged         Progression   Progression  Continue to progress workloads to maintain intensity without signs/symptoms of physical distress.       Average METs  2.5         Resistance Training   Training Prescription  Yes       Weight  3 lb       Reps  10-15         Interval Training   Interval Training   No         Treadmill   MPH  2.7       Grade  0       Minutes  15       METs  3.07         T5 Nustep   Level  3       SPM  80       Minutes  15       METs  2         Home Exercise Plan   Plans to continue exercise at  Home (comment) walking, staff videos       Frequency  Add 2 additional days to program exercise sessions.       Initial Home Exercises Provided  04/17/19          Exercise Comments: Exercise Comments    Row Name 03/29/19 [phone]           Exercise Comments  First full day of exercise!  Patient was oriented to gym and equipment including functions, settings, policies, and procedures.  Patient's individual exercise prescription and treatment plan were reviewed.  All starting workloads were established based on the results of the 6 minute walk test done at initial orientation visit.  The plan for exercise progression was also introduced and progression will be customized based on patient's performance and goals..          Exercise Goals and Review: Exercise Goals    Row Name 03/22/19 0904             Exercise Goals   Increase Physical Activity  Yes       Intervention  Provide advice, education, support and counseling about physical activity/exercise needs.;Develop an individualized exercise prescription for aerobic and resistive training based on initial evaluation findings, risk stratification, comorbidities and participant's personal goals.       Expected Outcomes  Short Term: Attend rehab on a regular basis to increase amount of physical activity.;Long Term: Add in home exercise to make exercise part of routine and to increase amount of physical activity.;Long Term: Exercising regularly at least 3-5 days a week.       Increase Strength and Stamina  Yes       Intervention  Provide advice, education, support and counseling  about physical activity/exercise needs.       Expected Outcomes  Short Term: Increase workloads from initial exercise prescription for  resistance, speed, and METs.;Short Term: Perform resistance training exercises routinely during rehab and add in resistance training at home;Long Term: Improve cardiorespiratory fitness, muscular endurance and strength as measured by increased METs and functional capacity (6MWT)       Able to understand and use rate of perceived exertion (RPE) scale  Yes       Intervention  Provide education and explanation on how to use RPE scale       Expected Outcomes  Short Term: Able to use RPE daily in rehab to express subjective intensity level;Long Term:  Able to use RPE to guide intensity level when exercising independently       Able to understand and use Dyspnea scale  Yes       Intervention  Provide education and explanation on how to use Dyspnea scale       Expected Outcomes  Short Term: Able to use Dyspnea scale daily in rehab to express subjective sense of shortness of breath during exertion;Long Term: Able to use Dyspnea scale to guide intensity level when exercising independently       Knowledge and understanding of Target Heart Rate Range (THRR)  Yes       Intervention  Provide education and explanation of THRR including how the numbers were predicted and where they are located for reference       Expected Outcomes  Short Term: Able to state/look up THRR;Short Term: Able to use daily as guideline for intensity in rehab;Long Term: Able to use THRR to govern intensity when exercising independently       Able to check pulse independently  Yes       Intervention  Provide education and demonstration on how to check pulse in carotid and radial arteries.;Review the importance of being able to check your own pulse for safety during independent exercise       Expected Outcomes  Short Term: Able to explain why pulse checking is important during independent exercise;Long Term: Able to check pulse independently and accurately       Understanding of Exercise Prescription  Yes       Intervention  Provide education,  explanation, and written materials on patient's individual exercise prescription       Expected Outcomes  Short Term: Able to explain program exercise prescription;Long Term: Able to explain home exercise prescription to exercise independently          Exercise Goals Re-Evaluation : Exercise Goals Re-Evaluation    Row Name 03/29/19 2229 04/17/19 1146 05/01/19 1338 05/01/19 1339 05/16/19 1218     Exercise Goal Re-Evaluation   Exercise Goals Review  Able to understand and use Dyspnea scale;Able to understand and use rate of perceived exertion (RPE) scale;Knowledge and understanding of Target Heart Rate Range (THRR);Able to check pulse independently;Understanding of Exercise Prescription  Increase Physical Activity;Increase Strength and Stamina;Understanding of Exercise Prescription  Increase Physical Activity;Increase Strength and Stamina;Able to understand and use rate of perceived exertion (RPE) scale;Able to understand and use Dyspnea scale;Knowledge and understanding of Target Heart Rate Range (THRR);Able to check pulse independently;Understanding of Exercise Prescription  --  Increase Physical Activity;Increase Strength and Stamina;Understanding of Exercise Prescription   Comments  Reviewed RPE scale, THR and program prescription with pt today.  Pt voiced understanding and was given a copy of goals to take home.  Terri Hughes is off to a good start  in rehab.  She is already walking 1-2 miles a day.  --  Terri Hughes is progressing well. She is at level 4 on NS and up to 2.7 mph walking.  Terri Hughes is doing well in rehab.  She has now also added in pelvic floor therapy into her exericse routine.  She had a fall over the weekend and is wearing a brace on her wrist.  She up to 20 watts on the recumbent bike now.  We will continue to monitor her progress.   Expected Outcomes  Short: Use RPE daily to regulate intensity. Long: Follow program prescription in THR.  Short: Continue to add in exercise at home.  Long: Continue to  improve stamina.  --  Short : continue to progress workloads Long: increase overall stamina  Short: Return to full exercise  Long: Continue to improve stamina   Row Name 05/24/19 1154             Exercise Goal Re-Evaluation   Exercise Goals Review  Increase Physical Activity;Increase Strength and Stamina;Understanding of Exercise Prescription       Comments  Terri Hughes is doing well in rehab.  She has now also added in pelvic floor therapy into her exericse routine. Pt is going to [employer] 5x/week (1.5 hours) and does weights everyday - she will rotate machines at [employer] and does resistance using the videos we have provided using 3 pound weights. Pt reports with rehab she is exercising 7x/week - encouraged pt that she is doing well, but to consider at least one rest day which could include light movement - Estill Bamberg also spoke to her during goals to address this; one day of rest doing light exercise like stretching and PT - pt reports that she feels like her breathing gets worse when she misses one day, Estill Bamberg discussed doing T5/T4 with light RPE so that her body can rest; pt reports getting enough sleep.  We will continue to monitor progress.       Expected Outcomes  Short: continue with exercise at home   Long: Continue to improve stamina          Discharge Exercise Prescription (Final Exercise Prescription Changes): Exercise Prescription Changes - 05/29/19 1400      Response to Exercise   Blood Pressure (Admit)  128/80    Blood Pressure (Exercise)  158/64    Blood Pressure (Exit)  116/74    Heart Rate (Admit)  59 bpm    Heart Rate (Exercise)  102 bpm    Heart Rate (Exit)  74 bpm    Oxygen Saturation (Admit)  95 %    Oxygen Saturation (Exercise)  90 %    Oxygen Saturation (Exit)  94 %    Rating of Perceived Exertion (Exercise)  13    Perceived Dyspnea (Exercise)  3    Symptoms  none    Duration  Continue with 30 min of aerobic exercise without signs/symptoms of physical distress.     Intensity  THRR unchanged      Progression   Progression  Continue to progress workloads to maintain intensity without signs/symptoms of physical distress.    Average METs  2.5      Resistance Training   Training Prescription  Yes    Weight  3 lb    Reps  10-15      Interval Training   Interval Training  No      Treadmill   MPH  2.7    Grade  0  Minutes  15    METs  3.07      T5 Nustep   Level  3    SPM  80    Minutes  15    METs  2      Home Exercise Plan   Plans to continue exercise at  Home (comment)   walking, staff videos   Frequency  Add 2 additional days to program exercise sessions.    Initial Home Exercises Provided  04/17/19       Nutrition:  Target Goals: Understanding of nutrition guidelines, daily intake of sodium <1531m, cholesterol <2047m calories 30% from fat and 7% or less from saturated fats, daily to have 5 or more servings of fruits and vegetables.  Education: Controlling Sodium/Reading Food Labels -Group verbal and written material supporting the discussion of sodium use in heart healthy nutrition. Review and explanation with models, verbal and written materials for utilization of the food label.   Education: General Nutrition Guidelines/Fats and Fiber: -Group instruction provided by verbal, written material, models and posters to present the general guidelines for heart healthy nutrition. Gives an explanation and review of dietary fats and fiber.   Biometrics: Pre Biometrics - 03/22/19 0905      Pre Biometrics   Height  5' 2.9" (1.598 m)    Weight  243 lb (110.2 kg)    BMI (Calculated)  43.16    Single Leg Stand  15.81 seconds        Nutrition Therapy Plan and Nutrition Goals: Nutrition Therapy & Goals - 04/05/19 1502      Nutrition Therapy   Diet  Low Na, HH diet    Protein (specify units)  90g    Fiber  25 grams    Whole Grain Foods  3 servings    Saturated Fats  12 max. grams    Fruits and Vegetables  5 servings/day     Sodium  1.5 grams      Personal Nutrition Goals   Nutrition Goal  ST: Add in more vegetables LT: pt wants to lose weight    Comments  trying to keep kcals 1500 and CHO at 150g. Uses boost protein shakes (AM with multivitamin). 2 low CHO tortillas with canadian bacon. piece of fruit. pears with low sugar syrup and cottage cheese or tu[place]andwich with white wheat. Pork skins snack or jerky (b/c low CHO). Pork chop, roasted vegetables, and corn. Sugar free jello. no sugar crystal light tea or water. Pt reports having IBS and doesn't like eggs, chicken, and a lot of vegetables. Talked about sneaking in some vegetables - ex: smoothie and spinach. Discussed HH eating, honoring hunger, and increased needs due to pumonary issues.      Intervention Plan   Intervention  Prescribe, educate and counsel regarding individualized specific dietary modifications aiming towards targeted core components such as weight, hypertension, lipid management, diabetes, heart failure and other comorbidities.;Nutrition handout(s) given to patient.    Expected Outcomes  Short Term Goal: Understand basic principles of dietary content, such as calories, fat, sodium, cholesterol and nutrients.;Short Term Goal: A plan has been developed with personal nutrition goals set during dietitian appointment.;Long Term Goal: Adherence to prescribed nutrition plan.       Nutrition Assessments: Nutrition Assessments - 03/22/19 0905      MEDFICTS Scores   Pre Score  62       MEDIFICTS Score Key:          ?70 Need to make dietary changes  40-70 Heart Healthy Diet         ? 40 Therapeutic Level Cholesterol Diet  Nutrition Goals Re-Evaluation: Nutrition Goals Re-Evaluation    [place] Name 05/08/19 1143 05/24/19 1201           Goals   Nutrition Goal  ST: Add in more vegetables LT: pt wants to lose weight  ST: Add in more vegetables LT: pt wants to lose weight      Comment  Pt reports being able to add 1 extra vegetable per  meal. Pt reports that she found a meal app that uses healthy recipes and gives you a grocery list for them as well. Pt reports this has helped her eat less fried foods.  Pt reports doing well and is using a ninja grill - cooking meat and vegetables in more HH ways. Getting a variety of colors. Pt reports doing well and would like to continue with what she is currently doing.      Expected Outcome  ST: Add in more vegetables LT: pt wants to lose weight  ST: Add in more vegetables LT: pt wants to lose weight         Nutrition Goals Discharge (Final Nutrition Goals Re-Evaluation): Nutrition Goals Re-Evaluation - 05/24/19 1201      Goals   Nutrition Goal  ST: Add in more vegetables LT: pt wants to lose weight    Comment  Pt reports doing well and is using a ninja grill - cooking meat and vegetables in more HH ways. Getting a variety of colors. Pt reports doing well and would like to continue with what she is currently doing.    Expected Outcome  ST: Add in more vegetables LT: pt wants to lose weight       Psychosocial: Target Goals: Acknowledge presence or absence of significant depression and/or stress, maximize coping skills, provide positive support system. Participant is able to verbalize types and ability to use techniques and skills needed for reducing stress and depression.   Education: Depression - Provides group verbal and written instruction on the correlation between heart/lung disease and depressed mood, treatment options, and the stigmas associated with seeking treatment.   Education: Sleep Hygiene -Provides group verbal and written instruction about how sleep can affect your health.  Define sleep hygiene, discuss sleep cycles and impact of sleep habits. Review good sleep hygiene tips.    Education: Stress and Anxiety: - Provides group verbal and written instruction about the health risks of elevated stress and causes of high stress.  Discuss the correlation between heart/lung  disease and anxiety and treatment options. Review healthy ways to manage with stress and anxiety.   Initial Review & Psychosocial Screening: Initial Psych Review & Screening - 03/21/19 1118      Initial Review   Current issues with  Current Depression;Current Anxiety/Panic;Current Stress Concerns    Source of Stress Concerns  Unable to participate in former interests or hobbies;Unable to perform yard/household activities;Retirement/disability;Financial;Chronic Illness    Comments  Currently applying for disability (first round was denied), has tried Xanax before, but not currently on it, anti-depressive make suicidal ideations worse, bowel and bladder incontenince (vaginal fault) and has appt with [facility]?  Yes   six children (three are local)   Comments  Three of her six children live in the area and has a granddaughter that lives with her to help out.      Barriers  Psychosocial barriers to participate in program  The patient should benefit from training in stress management and relaxation.;There are no identifiable barriers or psychosocial needs.      Screening Interventions   Interventions  Encouraged to exercise;Provide feedback about the scores to participant;To provide support and resources with identified psychosocial needs    Expected Outcomes  Long Term Goal: Stressors or current issues are controlled or eliminated.;Short Term goal: Identification and review with participant of any Quality of Life or Depression concerns found by scoring the questionnaire.;Long Term goal: The participant improves quality of Life and PHQ9 Scores as seen by post scores and/or verbalization of changes       Quality of Life Scores:  Scores of 19 and below usually indicate a poorer quality of life in these areas.  A difference of  2-3 points is a clinically meaningful difference.  A difference of 2-3 points in the total score of the Quality of Life Index has  been associated with significant improvement in overall quality of life, self-image, physical symptoms, and general health in studies assessing change in quality of life.  PHQ-9: Recent Review Flowsheet Data    Depression screen [facility] 2/9 05/03/2019 04/16/2019 03/22/2019   Decreased Interest _0 Down, Depressed, Hopeless _1 PHQ - 2 Score _2 Altered sleeping _3 Tired, decreased energy _4 Change in appetite 2 0 3   Feeling bad or failure about yourself  _5 Trouble concentrating _6 Moving slowly or fidgety/restless 2 0 0   Suicidal thoughts 0 0 0   PHQ-9 Score _7 Difficult doing work/chores Somewhat difficult Somewhat difficult Very difficult     Interpretation of Total Score  Total Score Depression Severity:  1-4 = Minimal depression, 5-9 = Mild depression, 10-14 = Moderate depression, 15-19 = Moderately severe depression, 20-27 = Severe depression   Psychosocial Evaluation and Intervention: Psychosocial Evaluation - 03/21/19 1129      Psychosocial Evaluation & Interventions   Interventions  Stress management education;Encouraged to exercise with the program and follow exercise prescription    Comments  Terri Hughes is coming to pulmonary rehab.  She is coming in for COPD and had a recent hospitalization for an exacerbation and hypoxia.  She has a significant history of depression and anxiety.  She feels that her depression is currently under control but her anxiety is starting to spiral.  In the past she was on xanax for anxiety and it worked and helped her sleep, but she was taken off of it.  She does not do well on anti-depression meds as they seem to make her suicidal ideations worse.  She is currently thinking about seeing someone at the [place] [facility] for help with her anxiety.  She is also hoping that the exercise will help as well.  She is looking forwad to the program.  She wants to learn to be comfortable with exercise and build her confidence back up.   She doesn't want to continue to worry about her heart and sats dropping when she exercises.  She will benefit from exercise from the strength and stamina side as well.  She also has issues with bowel and urinary incontenince that ofter leads to infections.  She is planning to see her OB-GYN about this and is considering pelvic floor therapy as well.    Expected Outcomes  Short: Attend  rehab regularly to build confidence in her ability to exercise.  Long: Better management of anxiety.    Continue Psychosocial Services   Follow up required by staff       Psychosocial Re-Evaluation: Psychosocial Re-Evaluation    [place] Name 04/16/19 [phone] 04/17/19 1153 05/24/19 1151         Psychosocial Re-Evaluation   Current issues with  Current Depression  Current Depression  Current Depression     Comments  PHQ 9 repeated  Score decreased 17 to 7 today.  Terri Hughes is doing better now that she is in rehab.  She is still getting frustrated with sats dropping at home, but we made a plan to start to talk to the doctor about it.  We also reviewed  COPD action plan to help manage breathing. Her depression is getting better overall.  Pt taking effexor 37.5 dose. Pt reports feeling better now on the new medication, but is still frustrated about her breathing and not having an O2 tank. Support system is children- Curator and mom live with her.     Expected Outcomes  STG continues decrease in PHQ 9 score as attends program and works on healing. LTG: Phq9 in normal range  Short: Continue to attend rehab and talk to doctor.  Long: Work on positive talk and self care.  Short: Continue to attend rehab and talk to doctor.  Long: Work on positive talk and self care.     Interventions  Encouraged to attend Pulmonary Rehabilitation for the exercise  Encouraged to attend Pulmonary Rehabilitation for the exercise;Stress management education;Relaxation education  Encouraged to attend Pulmonary Rehabilitation for the exercise;Stress  management education;Relaxation education     Continue Psychosocial Services   Follow up required by staff  Follow up required by staff  Follow up required by staff        Psychosocial Discharge (Final Psychosocial Re-Evaluation): Psychosocial Re-Evaluation - 05/24/19 1151      Psychosocial Re-Evaluation   Current issues with  Current Depression    Comments  Pt taking effexor 37.5 dose. Pt reports feeling better now on the new medication, but is still frustrated about her breathing and not having an O2 tank. Support system is children- Curator and mom live with her.    Expected Outcomes  Short: Continue to attend rehab and talk to doctor.  Long: Work on positive talk and self care.    Interventions  Encouraged to attend Pulmonary Rehabilitation for the exercise;Stress management education;Relaxation education    Continue Psychosocial Services   Follow up required by staff       Education: Education Goals: Education classes will be provided on a weekly basis, covering required topics. Participant will state understanding/return demonstration of topics presented.  Learning Barriers/Preferences: Learning Barriers/Preferences - 03/21/19 1113      Learning Barriers/Preferences   Learning Barriers  Reading;Sight   occasional "brain fog", glasses   Learning Preferences  Written Material       General Pulmonary Education Topics:  Infection Prevention: - Provides verbal and written material to individual with discussion of infection control including proper hand washing and proper equipment cleaning during exercise session.   Pulmonary Rehab from 03/22/2019 in [facility] Cardiac and Pulmonary Rehab  Date  03/22/19  Educator  [facility]  Instruction Review Code  1- Verbalizes Understanding      Falls Prevention: - Provides verbal and written material to individual with discussion of falls prevention and safety.   Pulmonary Rehab from 03/22/2019 in [facility] Cardiac and Pulmonary Rehab  Date   03/22/19  Educator  [facility]  Instruction Review Code  1- Verbalizes Understanding      Chronic Lung Diseases: - Group verbal and written instruction to review updates, respiratory medications, advancements in procedures and treatments. Discuss use of supplemental oxygen including available portable oxygen systems, continuous and intermittent flow rates, concentrators, personal use and safety guidelines. Review proper use of inhaler and spacers. Provide informative websites for self-education.    Energy Conservation: - Provide group verbal and written instruction for methods to conserve energy, plan and organize activities. Instruct on pacing techniques, use of adaptive equipment and posture/positioning to relieve shortness of breath.   Triggers and Exacerbations: - Group verbal and written instruction to review types of environmental triggers and ways to prevent exacerbations. Discuss weather changes, air quality and the benefits of nasal washing. Review warning signs and symptoms to help prevent infections. Discuss techniques for effective airway clearance, coughing, and vibrations.   AED/CPR: - Group verbal and written instruction with the use of models to demonstrate the basic use of the AED with the basic ABC's of resuscitation.   Anatomy and Physiology of the Lungs: - Group verbal and written instruction with the use of models to provide basic lung anatomy and physiology related to function, structure and complications of lung disease.   Anatomy & Physiology of the Heart: - Group verbal and written instruction and models provide basic cardiac anatomy and physiology, with the coronary electrical and arterial systems. Review of Valvular disease and Heart Failure   Cardiac Medications: - Group verbal and written instruction to review commonly prescribed medications for heart disease. Reviews the medication, class of the drug, and side effects.   Other: -Provides group and verbal  instruction on various topics (see comments)   Knowledge Questionnaire Score: Knowledge Questionnaire Score - 03/22/19 0906      Knowledge Questionnaire Score   Pre Score  16/18 Education Focus: Oxygen Safety        Core Components/Risk Factors/Patient Goals at Admission: Personal Goals and Risk Factors at Admission - 03/22/19 0906      Core Components/Risk Factors/Patient Goals on Admission    Weight Management  Yes;Obesity;Weight Loss    Intervention  Weight Management: Develop a combined nutrition and exercise program designed to reach desired caloric intake, while maintaining appropriate intake of nutrient and fiber, sodium and fats, and appropriate energy expenditure required for the weight goal.;Weight Management: Provide education and appropriate resources to help participant work on and attain dietary goals.;Weight Management/Obesity: Establish reasonable short term and long term weight goals.;Obesity: Provide education and appropriate resources to help participant work on and attain dietary goals.    Admit Weight  230 lb (104.3 kg)    Goal Weight: Short Term  225 lb (102.1 kg)    Goal Weight: Long Term  220 lb (99.8 kg)    Expected Outcomes  Short Term: Continue to assess and modify interventions until short term weight is achieved;Long Term: Adherence to nutrition and physical activity/exercise program aimed toward attainment of established weight goal;Weight Loss: Understanding of general recommendations for a balanced deficit meal plan, which promotes 1-2 lb weight loss per week and includes a negative energy balance of [id number] kcal/d;Understanding recommendations for meals to include 15-35% energy as protein, 25-35% energy from fat, 35-60% energy from carbohydrates, less than 257m of dietary cholesterol, 20-35 gm of total fiber daily;Understanding of distribution of calorie intake throughout the day with the consumption of 4-5 meals/snacks    Tobacco Cessation  Yes  Intervention  Advice worker, assist with locating and accessing local/national Quit Smoking programs, and support quit date choice.    Expected Outcomes  Short Term: Will quit all tobacco product use, adhering to prevention of relapse plan.;Long Term: Complete abstinence from all tobacco products for at least 12 months from quit date.    Improve shortness of breath with ADL's  Yes    Intervention  Provide education, individualized exercise plan and daily activity instruction to help decrease symptoms of SOB with activities of daily living.    Expected Outcomes  Short Term: Improve cardiorespiratory fitness to achieve a reduction of symptoms when performing ADLs;Long Term: Be able to perform more ADLs without symptoms or delay the onset of symptoms    Heart Failure  Yes    Intervention  Provide a combined exercise and nutrition program that is supplemented with education, support and counseling about heart failure. Directed toward relieving symptoms such as shortness of breath, decreased exercise tolerance, and extremity edema.    Expected Outcomes  Improve functional capacity of life;Short term: Attendance in program 2-3 days a week with increased exercise capacity. Reported lower sodium intake. Reported increased fruit and vegetable intake. Reports medication compliance.;Short term: Daily weights obtained and reported for increase. Utilizing diuretic protocols set by physician.;Long term: Adoption of self-care skills and reduction of barriers for early signs and symptoms recognition and intervention leading to self-care maintenance.       Education:Diabetes - Individual verbal and written instruction to review signs/symptoms of diabetes, desired ranges of glucose level fasting, after meals and with exercise. Acknowledge that pre and post exercise glucose checks will be done for 3 sessions at entry of program.   Education: Know Your Numbers and Risk Factors: -Group verbal and written  instruction about important numbers in your health.  Discussion of what are risk factors and how they play a role in the disease process.  Review of Cholesterol, Blood Pressure, Diabetes, and BMI and the role they play in your overall health.   Core Components/Risk Factors/Patient Goals Review:  Goals and Risk Factor Review    [place] Name 04/17/19 1155 05/24/19 1138           Core Components/Risk Factors/Patient Goals Review   Personal Goals Review  Weight Management/Obesity;Tobacco Cessation;Improve shortness of breath with ADL's;Hypertension  Weight Management/Obesity;Tobacco Cessation;Improve shortness of breath with ADL's;Hypertension      Review  Her weight was up recently, but she is trying to get it back down.  She has already started to take steps to improve it again and keep moving.  She is still doing well staying away from cigarettes.  Breathing is improving. and Pressures have been good.  Pt reports that she is still trying to get an O2 tank for exercise. She reports her O2 saturation drops below 90 and were 86 for 5 minutes of a 30 minute workout at her gym. She recently fell off of a treadmil at the gym when she forgot to put the safety on, pt reports that she is fine but hurt her left hand. Pt reports SOB has been the same and is waiting on her doctor to get back to her about the O2 tank. Pressures have been good. Pt reports doing very well with smoking cessation and reports "I can't believe I ever smoked".      Expected Outcomes  Short: Work on weight loss.  Long: Continue monitor risk factors.  ST: O2 tank, continue with work outside of the gym  LT: Continue  monitor risk factors.         Core Components/Risk Factors/Patient Goals at Discharge (Final Review):  Goals and Risk Factor Review - 05/24/19 1138      Core Components/Risk Factors/Patient Goals Review   Personal Goals Review  Weight Management/Obesity;Tobacco Cessation;Improve shortness of breath with ADL's;Hypertension     Review  Pt reports that she is still trying to get an O2 tank for exercise. She reports her O2 saturation drops below 90 and were 86 for 5 minutes of a 30 minute workout at her gym. She recently fell off of a treadmil at the gym when she forgot to put the safety on, pt reports that she is fine but hurt her left hand. Pt reports SOB has been the same and is waiting on her doctor to get back to her about the O2 tank. Pressures have been good. Pt reports doing very well with smoking cessation and reports "I can't believe I ever smoked".    Expected Outcomes  ST: O2 tank, continue with work outside of the gym  LT: Continue monitor risk factors.       ITP Comments: ITP Comments    Row Name 03/21/19 1140 03/22/19 0857 03/22/19 0858 03/29/19 0818 04/04/19 1429   ITP Comments  Completed virtual orientation today.  EP eval scheduled for 1/14 at 8am.  Documentation for diagnosis can be in [facility] encounter 01/03/19 and 02/28/19.  Completed 6MWT and gym orientation.  Initial ITP created and sent for review to Dr. Emily Filbert, Medical Director.  Emlyn has recently quit tobacco use within the last 6 months. Intervention for relapse prevention was provided at the initial medical review. She was encouraged to continue to with tobacco cessation and was provided information on relapse prevention. Patient received information about combination therapy, tobacco cessation classes, quit line, and quit smoking apps in case of a relapse. Patient demonstrated understanding of this material.Staff will continue to provide encouragement and follow up with the patient throughout the program.  First full day of exercise!  Patient was oriented to gym and equipment including functions, settings, policies, and procedures.  Patient's individual exercise prescription and treatment plan were reviewed.  All starting workloads were established based on the results of the 6 minute walk test done at initial orientation visit.  The plan for exercise  progression was also introduced and progression will be customized based on patient's performance and goals..  30 day review completed. ITP sent to Dr. Emily Filbert, Medical Director of Cardiac and Pulmonary Rehab. Continue with ITP unless changes are made by physician.  Department operating under reduced schedule until further notice by request from hospital leadership.  New to program oriented 03/22/19.   Drew Name 04/05/19 1522 04/16/19 0842 05/02/19 0643 05/30/19 0652 06/07/19 1629   ITP Comments  Completed Initial RD Eval  PHQ 9 repeated  Score decreased 17 to 7 today.  30 day chart review completed. ITP sent to Dr Zachery Dakins Medical Director, for review,changes as needed and signature.  30 day chart review completed. ITP sent to Dr Zachery Dakins Medical Director, for review,changes as needed and signature. Continue with ITP if no changes requested  Terri Hughes will be out the next three weeks and will be out of town.   Bernard Name 06/15/19 1305 06/27/19 1047 07/05/19 1418 07/25/19 0631     ITP Comments  Terri Hughes is still out of town.  30 day chart review completed. ITP sent to Dr Zachery Dakins Medical Director, for review,changes as needed and signature.  Continue with ITP if no changes requested  Called to check on pt.  Last check in she was going to be out for three weeks due to a family circumstance.  She has continued to be out now for 4 weeks. Left message.  30 Day review completed. ITP review done, changes made as directed,and approval shown by signature of  Scientist, research (life sciences).       Comments:

## 2019-07-26 ENCOUNTER — Encounter: Payer: Self-pay | Admitting: *Deleted

## 2019-07-26 DIAGNOSIS — J449 Chronic obstructive pulmonary disease, unspecified: Secondary | ICD-10-CM

## 2019-07-26 NOTE — Progress Notes (Signed)
bDischarge Progress Report  Patient Details  Name: Terri Hughes MRN: [id number] Date of Birth: [birth year] Referring Provider:     Pulmonary Rehab from 03/22/2019 in [facility]  Referring Provider  Thad Ranger MD       Number of Visits: 12  Reason for Discharge:  Early Exit:  Lack of attendance  Smoking History:  Social History   Tobacco Use  Smoking Status Former Smoker  . Packs/day: 3.00  . Years: 47.00  . Pack years: 141.00  . Types: Cigarettes  . Start date: 04/18/1971  . Quit date: 01/02/2019  . Years since quitting: 0.5  Smokeless Tobacco Never Used    Diagnosis:  Chronic obstructive pulmonary disease, unspecified COPD type ([place])  ADL UCSD: Pulmonary Assessment Scores    Row Name 03/22/19 0907         ADL UCSD   ADL Phase  Entry     SOB Score total  86     Rest  0     Walk  3     Stairs  5     Bath  4     Dress  4     Shop  5       CAT Score   CAT Score  28       mMRC Score   mMRC Score  4        Initial Exercise Prescription: Initial Exercise Prescription - 03/22/19 0900      Date of Initial Exercise RX and Referring Provider   Date  03/22/19    Referring Provider  Thad Ranger MD      Treadmill   MPH  1.7    Grade  0    Minutes  15    METs  2.3      NuStep   Level  1    SPM  80    Minutes  15    METs  2      Biostep-RELP   Level  1    SPM  50    Minutes  15    METs  2      Prescription Details   Frequency (times per week)  1    Duration  Progress to 30 minutes of continuous aerobic without signs/symptoms of physical distress      Intensity   THRR 40-80% of Max Heartrate  110-145    Ratings of Perceived Exertion  11-13    Perceived Dyspnea  0-4      Progression   Progression  Continue to progress workloads to maintain intensity without signs/symptoms of physical distress.      Resistance Training   Training Prescription  Yes    Weight  3 lbs    Reps  10-15       Discharge Exercise  Prescription (Final Exercise Prescription Changes): Exercise Prescription Changes - 05/29/19 1400      Response to Exercise   Blood Pressure (Admit)  128/80    Blood Pressure (Exercise)  158/64    Blood Pressure (Exit)  116/74    Heart Rate (Admit)  59 bpm    Heart Rate (Exercise)  102 bpm    Heart Rate (Exit)  74 bpm    Oxygen Saturation (Admit)  95 %    Oxygen Saturation (Exercise)  90 %    Oxygen Saturation (Exit)  94 %    Rating of Perceived Exertion (Exercise)  13    Perceived Dyspnea (Exercise)  3  Symptoms  none    Duration  Continue with 30 min of aerobic exercise without signs/symptoms of physical distress.    Intensity  THRR unchanged      Progression   Progression  Continue to progress workloads to maintain intensity without signs/symptoms of physical distress.    Average METs  2.5      Resistance Training   Training Prescription  Yes    Weight  3 lb    Reps  10-15      Interval Training   Interval Training  No      Treadmill   MPH  2.7    Grade  0    Minutes  15    METs  3.07      T5 Nustep   Level  3    SPM  80    Minutes  15    METs  2      Home Exercise Plan   Plans to continue exercise at  Home (comment)   walking, staff videos   Frequency  Add 2 additional days to program exercise sessions.    Initial Home Exercises Provided  04/17/19       Functional Capacity: 6 Minute Walk    Row Name 03/22/19 0858         6 Minute Walk   Phase  Initial     Distance  1200 feet     Walk Time  6 minutes     # of Rest Breaks  0     MPH  2.27     METS  2.33     RPE  15     Perceived Dyspnea   4     VO2 Peak  8.14     Symptoms  Yes (comment)     Comments  dizzy (improved with rest), back pain 6/10, SOB     Resting HR  75 bpm     Resting BP  114/66     Resting Oxygen Saturation   96 %     Exercise Oxygen Saturation  during 6 min walk  87 %     Max Ex. HR  117 bpm     Max Ex. BP  144/70     2 Minute Post BP  136/72       Interval HR   1  Minute HR  100     2 Minute HR  110     3 Minute HR  116     4 Minute HR  117     5 Minute HR  115     6 Minute HR  109     2 Minute Post HR  79     Interval Heart Rate?  Yes       Interval Oxygen   Interval Oxygen?  Yes     Baseline Oxygen Saturation %  96 %     1 Minute Oxygen Saturation %  88 %     1 Minute Liters of Oxygen  0 L Room Air     2 Minute Oxygen Saturation %  87 %     2 Minute Liters of Oxygen  0 L     3 Minute Oxygen Saturation %  88 %     3 Minute Liters of Oxygen  0 L     4 Minute Oxygen Saturation %  89 %     4 Minute Liters of Oxygen  0 L     5 Minute  Oxygen Saturation %  87 %     5 Minute Liters of Oxygen  0 L     6 Minute Oxygen Saturation %  88 %     6 Minute Liters of Oxygen  0 L     2 Minute Post Oxygen Saturation %  94 %     2 Minute Post Liters of Oxygen  0 L        Psychological, QOL, Others - Outcomes: PHQ 2/9: Depression screen [facility] 2/9 05/03/2019 04/16/2019 03/22/2019  Decreased Interest 1 1 1   Down, Depressed, Hopeless 2 1 2   PHQ - 2 Score 3 2 3   Altered sleeping 3 1 3   Tired, decreased energy 2 2 3   Change in appetite 2 0 3  Feeling bad or failure about yourself  1 1 2   Trouble concentrating 3 1 3   Moving slowly or fidgety/restless 2 0 0  Suicidal thoughts 0 0 0  PHQ-9 Score 16 7 17   Difficult doing work/chores Somewhat difficult Somewhat difficult Very difficult    Quality of Life:   Personal Goals: Goals established at orientation with interventions provided to work toward goal. Personal Goals and Risk Factors at Admission - 03/22/19 0906      Core Components/Risk Factors/Patient Goals on Admission    Weight Management  Yes;Obesity;Weight Loss    Intervention  Weight Management: Develop a combined nutrition and exercise program designed to reach desired caloric intake, while maintaining appropriate intake of nutrient and fiber, sodium and fats, and appropriate energy expenditure required for the weight goal.;Weight Management:  Provide education and appropriate resources to help participant work on and attain dietary goals.;Weight Management/Obesity: Establish reasonable short term and long term weight goals.;Obesity: Provide education and appropriate resources to help participant work on and attain dietary goals.    Admit Weight  230 lb (104.3 kg)    Goal Weight: Short Term  225 lb (102.1 kg)    Goal Weight: Long Term  220 lb (99.8 kg)    Expected Outcomes  Short Term: Continue to assess and modify interventions until short term weight is achieved;Long Term: Adherence to nutrition and physical activity/exercise program aimed toward attainment of established weight goal;Weight Loss: Understanding of general recommendations for a balanced deficit meal plan, which promotes 1-2 lb weight loss per week and includes a negative energy balance of [phone] kcal/d;Understanding recommendations for meals to include 15-35% energy as protein, 25-35% energy from fat, 35-60% energy from carbohydrates, less than 200mg  of dietary cholesterol, 20-35 gm of total fiber daily;Understanding of distribution of calorie intake throughout the day with the consumption of 4-5 meals/snacks    Tobacco Cessation  Yes    Intervention  Offer self-teaching materials, assist with locating and accessing local/national Quit Smoking programs, and support quit date choice.    Expected Outcomes  Short Term: Will quit all tobacco product use, adhering to prevention of relapse plan.;Long Term: Complete abstinence from all tobacco products for at least 12 months from quit date.    Improve shortness of breath with ADL's  Yes    Intervention  Provide education, individualized exercise plan and daily activity instruction to help decrease symptoms of SOB with activities of daily living.    Expected Outcomes  Short Term: Improve cardiorespiratory fitness to achieve a reduction of symptoms when performing ADLs;Long Term: Be able to perform more ADLs without symptoms or delay  the onset of symptoms    Heart Failure  Yes    Intervention  Provide a combined exercise and  nutrition program that is supplemented with education, support and counseling about heart failure. Directed toward relieving symptoms such as shortness of breath, decreased exercise tolerance, and extremity edema.    Expected Outcomes  Improve functional capacity of life;Short term: Attendance in program 2-3 days a week with increased exercise capacity. Reported lower sodium intake. Reported increased fruit and vegetable intake. Reports medication compliance.;Short term: Daily weights obtained and reported for increase. Utilizing diuretic protocols set by physician.;Long term: Adoption of self-care skills and reduction of barriers for early signs and symptoms recognition and intervention leading to self-care maintenance.        Personal Goals Discharge: Goals and Risk Factor Review    Row Name 04/17/19 1155 05/24/19 1138           Core Components/Risk Factors/Patient Goals Review   Personal Goals Review  Weight Management/Obesity;Tobacco Cessation;Improve shortness of breath with ADL's;Hypertension  Weight Management/Obesity;Tobacco Cessation;Improve shortness of breath with ADL's;Hypertension      Review  Her weight was up recently, but she is trying to get it back down.  She has already started to take steps to improve it again and keep moving.  She is still doing well staying away from cigarettes.  Breathing is improving. and Pressures have been good.  Pt reports that she is still trying to get an O2 tank for exercise. She reports her O2 saturation drops below 90 and were 86 for 5 minutes of a 30 minute workout at her gym. She recently fell off of a treadmil at the gym when she forgot to put the safety on, pt reports that she is fine but hurt her left hand. Pt reports SOB has been the same and is waiting on her doctor to get back to her about the O2 tank. Pressures have been good. Pt reports doing very  well with smoking cessation and reports "I can't believe I ever smoked".      Expected Outcomes  Short: Work on weight loss.  Long: Continue monitor risk factors.  ST: O2 tank, continue with work outside of the gym  LT: Continue monitor risk factors.         Exercise Goals and Review: Exercise Goals    Row Name 03/22/19 0904             Exercise Goals   Increase Physical Activity  Yes       Intervention  Provide advice, education, support and counseling about physical activity/exercise needs.;Develop an individualized exercise prescription for aerobic and resistive training based on initial evaluation findings, risk stratification, comorbidities and participant's personal goals.       Expected Outcomes  Short Term: Attend rehab on a regular basis to increase amount of physical activity.;Long Term: Add in home exercise to make exercise part of routine and to increase amount of physical activity.;Long Term: Exercising regularly at least 3-5 days a week.       Increase Strength and Stamina  Yes       Intervention  Provide advice, education, support and counseling about physical activity/exercise needs.       Expected Outcomes  Short Term: Increase workloads from initial exercise prescription for resistance, speed, and METs.;Short Term: Perform resistance training exercises routinely during rehab and add in resistance training at home;Long Term: Improve cardiorespiratory fitness, muscular endurance and strength as measured by increased METs and functional capacity (6MWT)       Able to understand and use rate of perceived exertion (RPE) scale  Yes  Intervention  Provide education and explanation on how to use RPE scale       Expected Outcomes  Short Term: Able to use RPE daily in rehab to express subjective intensity level;Long Term:  Able to use RPE to guide intensity level when exercising independently       Able to understand and use Dyspnea scale  Yes       Intervention  Provide education  and explanation on how to use Dyspnea scale       Expected Outcomes  Short Term: Able to use Dyspnea scale daily in rehab to express subjective sense of shortness of breath during exertion;Long Term: Able to use Dyspnea scale to guide intensity level when exercising independently       Knowledge and understanding of Target Heart Rate Range (THRR)  Yes       Intervention  Provide education and explanation of THRR including how the numbers were predicted and where they are located for reference       Expected Outcomes  Short Term: Able to state/look up THRR;Short Term: Able to use daily as guideline for intensity in rehab;Long Term: Able to use THRR to govern intensity when exercising independently       Able to check pulse independently  Yes       Intervention  Provide education and demonstration on how to check pulse in carotid and radial arteries.;Review the importance of being able to check your own pulse for safety during independent exercise       Expected Outcomes  Short Term: Able to explain why pulse checking is important during independent exercise;Long Term: Able to check pulse independently and accurately       Understanding of Exercise Prescription  Yes       Intervention  Provide education, explanation, and written materials on patient's individual exercise prescription       Expected Outcomes  Short Term: Able to explain program exercise prescription;Long Term: Able to explain home exercise prescription to exercise independently          Exercise Goals Re-Evaluation: Exercise Goals Re-Evaluation    Row Name 03/29/19 [id number] 04/17/19 1146 05/01/19 1338 05/01/19 1339 05/16/19 1218     Exercise Goal Re-Evaluation   Exercise Goals Review  Able to understand and use Dyspnea scale;Able to understand and use rate of perceived exertion (RPE) scale;Knowledge and understanding of Target Heart Rate Range (THRR);Able to check pulse independently;Understanding of Exercise Prescription  Increase  Physical Activity;Increase Strength and Stamina;Understanding of Exercise Prescription  Increase Physical Activity;Increase Strength and Stamina;Able to understand and use rate of perceived exertion (RPE) scale;Able to understand and use Dyspnea scale;Knowledge and understanding of Target Heart Rate Range (THRR);Able to check pulse independently;Understanding of Exercise Prescription  --  Increase Physical Activity;Increase Strength and Stamina;Understanding of Exercise Prescription   Comments  Reviewed RPE scale, THR and program prescription with pt today.  Pt voiced understanding and was given a copy of goals to take home.  Terri Hughes is off to a good start in rehab.  She is already walking 1-2 miles a day.  --  Terri Hughes is progressing well. She is at level 4 on NS and up to 2.7 mph walking.  Terri Hughes is doing well in rehab.  She has now also added in pelvic floor therapy into her exericse routine.  She had a fall over the weekend and is wearing a brace on her wrist.  She up to 20 watts on the recumbent bike now.  We will continue to  monitor her progress.   Expected Outcomes  Short: Use RPE daily to regulate intensity. Long: Follow program prescription in THR.  Short: Continue to add in exercise at home.  Long: Continue to improve stamina.  --  Short : continue to progress workloads Long: increase overall stamina  Short: Return to full exercise  Long: Continue to improve stamina   Row Name 05/24/19 1154             Exercise Goal Re-Evaluation   Exercise Goals Review  Increase Physical Activity;Increase Strength and Stamina;Understanding of Exercise Prescription       Comments  Terri Hughes is doing well in rehab.  She has now also added in pelvic floor therapy into her exericse routine. Pt is going to [employer] 5x/week (1.5 hours) and does weights everyday - she will rotate machines at [employer] and does resistance using the videos we have provided using 3 pound weights. Pt reports with rehab she is exercising  7x/week - encouraged pt that she is doing well, but to consider at least one rest day which could include light movement - Terri Hughes also spoke to her during goals to address this; one day of rest doing light exercise like stretching and PT - pt reports that she feels like her breathing gets worse when she misses one day, Terri Hughes discussed doing T5/T4 with light RPE so that her body can rest; pt reports getting enough sleep.  We will continue to monitor progress.       Expected Outcomes  Short: continue with exercise at home   Long: Continue to improve stamina          Nutrition & Weight - Outcomes: Pre Biometrics - 03/22/19 0905      Pre Biometrics   Height  5' 2.9" (1.598 m)    Weight  243 lb (110.2 kg)    BMI (Calculated)  43.16    Single Leg Stand  15.81 seconds        Nutrition: Nutrition Therapy & Goals - 04/05/19 1502      Nutrition Therapy   Diet  Low Na, HH diet    Protein (specify units)  90g    Fiber  25 grams    Whole Grain Foods  3 servings    Saturated Fats  12 max. grams    Fruits and Vegetables  5 servings/day    Sodium  1.5 grams      Personal Nutrition Goals   Nutrition Goal  ST: Add in more vegetables LT: pt wants to lose weight    Comments  trying to keep kcals 1500 and CHO at 150g. Uses boost protein shakes (AM with multivitamin). 2 low CHO tortillas with canadian bacon. piece of fruit. pears with low sugar syrup and cottage cheese or [place] sandwich with white wheat. Pork skins snack or jerky (b/c low CHO). Pork chop, roasted vegetables, and corn. Sugar free jello. no sugar crystal light tea or water. Pt reports having IBS and doesn't like eggs, chicken, and a lot of vegetables. Talked about sneaking in some vegetables - ex: smoothie and spinach. Discussed HH eating, honoring hunger, and increased needs due to pumonary issues.      Intervention Plan   Intervention  Prescribe, educate and counsel regarding individualized specific dietary modifications aiming towards  targeted core components such as weight, hypertension, lipid management, diabetes, heart failure and other comorbidities.;Nutrition handout(s) given to patient.    Expected Outcomes  Short Term Goal: Understand basic principles of dietary content, such  as calories, fat, sodium, cholesterol and nutrients.;Short Term Goal: A plan has been developed with personal nutrition goals set during dietitian appointment.;Long Term Goal: Adherence to prescribed nutrition plan.       Nutrition Discharge: Nutrition Assessments - 03/22/19 0905      MEDFICTS Scores   Pre Score  62       Education Questionnaire Score: Knowledge Questionnaire Score - 03/22/19 0906      Knowledge Questionnaire Score   Pre Score  16/18 Education Focus: Oxygen Safety       Goals reviewed with patient; copy given to patient.

## 2019-07-26 NOTE — Progress Notes (Signed)
Pulmonary Individual Treatment Plan  Patient Details  Name: Terri Hughes MRN: [id number] Date of Birth: 15-Mar-1960 Referring Provider:     Pulmonary Rehab from 03/22/2019 in [facility] Cardiac and Pulmonary Rehab  Referring Provider  Thad Ranger MD      Initial Encounter Date:    Pulmonary Rehab from 03/22/2019 in [facility] Cardiac and Pulmonary Rehab  Date  03/22/19      Visit Diagnosis: Chronic obstructive pulmonary disease, unspecified COPD type ([place])  Patient's Home Medications on Admission:  Current Outpatient Medications:  .  albuterol (ACCUNEB) 0.63 MG/3ML nebulizer solution, Take 1 ampule by nebulization every 6 (six) hours as needed for wheezing., Disp: , Rfl:  .  albuterol (VENTOLIN HFA) 108 (90 Base) MCG/ACT inhaler, Inhale 1-2 puffs into the lungs every 4 (four) hours as needed for wheezing or shortness of breath., Disp: , Rfl:  .  budesonide-formoterol (SYMBICORT) 160-4.5 MCG/ACT inhaler, Inhale 2 puffs into the lungs daily. , Disp: , Rfl:  .  montelukast (SINGULAIR) 10 MG tablet, Take 10 mg by mouth at bedtime. , Disp: , Rfl:  .  omeprazole (PRILOSEC) 20 MG capsule, Take 20 mg by mouth daily. , Disp: , Rfl:  .  tiotropium (SPIRIVA) 18 MCG inhalation capsule, Place into inhaler and inhale., Disp: , Rfl:  .  venlafaxine (EFFEXOR) 37.5 MG tablet, Take 37.5 mg by mouth daily., Disp: , Rfl:   Past Medical History: Past Medical History:  Diagnosis Date  . Asthma   . CHF (congestive heart failure) (HCC)     Tobacco Use: Social History   Tobacco Use  Smoking Status Former Smoker  . Packs/day: 3.00  . Years: 47.00  . Pack years: 141.00  . Types: Cigarettes  . Start date: 04/18/1971  . Quit date: 01/02/2019  . Years since quitting: 0.5  Smokeless Tobacco Never Used    Labs: Recent Review Flowsheet Data    There is no flowsheet data to display.       Pulmonary Assessment Scores: Pulmonary Assessment Scores    Row Name 03/22/19 0907         ADL UCSD   ADL  Phase  Entry     SOB Score total  86     Rest  0     Walk  3     Stairs  5     Bath  4     Dress  4     Shop  5       CAT Score   CAT Score  28       mMRC Score   mMRC Score  4        UCSD: Self-administered rating of dyspnea associated with activities of daily living (ADLs) 6-point scale (0 = "not at all" to 5 = "maximal or unable to do because of breathlessness")  Scoring Scores range from 0 to 120.  Minimally important difference is 5 units  CAT: CAT can identify the health impairment of COPD patients and is better correlated with disease progression.  CAT has a scoring range of zero to 40. The CAT score is classified into four groups of low (less than 10), medium (10 - 20), high (21-30) and very high (31-40) based on the impact level of disease on health status. A CAT score over 10 suggests significant symptoms.  A worsening CAT score could be explained by an exacerbation, poor medication adherence, poor inhaler technique, or progression of COPD or comorbid conditions.  CAT MCID is 2 points  mMRC: mMRC (Modified Medical Research Council) Dyspnea Scale is used to assess the degree of baseline functional disability in patients of respiratory disease due to dyspnea. No minimal important difference is established. A decrease in score of 1 point or greater is considered a positive change.   Pulmonary Function Assessment: Pulmonary Function Assessment - 03/22/19 0927      Initial Spirometry Results   FVC%  77 %   02/07/2019   FEV1%  53 %    FEV1/FVC Ratio  55      Post Bronchodilator Spirometry Results   FVC%  81 %    FEV1%  58 %    FEV1/FVC Ratio  57      Breath   Shortness of Breath  Yes;Fear of Shortness of Breath;Limiting activity;Panic with Shortness of Breath       Exercise Target Goals: Exercise Program Goal: Individual exercise prescription set using results from initial 6 min walk test and THRR while considering  patient's activity barriers and safety.    Exercise Prescription Goal: Initial exercise prescription builds to 30-45 minutes a day of aerobic activity, 2-3 days per week.  Home exercise guidelines will be given to patient during program as part of exercise prescription that the participant will acknowledge.  Education: Aerobic Exercise & Resistance Training: - Gives group verbal and written instruction on the various components of exercise. Focuses on aerobic and resistive training programs and the benefits of this training and how to safely progress through these programs..   Education: Exercise & Equipment Safety: - Individual verbal instruction and demonstration of equipment use and safety with use of the equipment.   Pulmonary Rehab from 03/22/2019 in [facility] Cardiac and Pulmonary Rehab  Date  03/22/19  Educator  [facility]  Instruction Review Code  1- Verbalizes Understanding      Education: Exercise Physiology & General Exercise Guidelines: - Group verbal and written instruction with models to review the exercise physiology of the cardiovascular system and associated critical values. Provides general exercise guidelines with specific guidelines to those with heart or lung disease.    Education: Flexibility, Balance, Mind/Body Relaxation: Provides group verbal/written instruction on the benefits of flexibility and balance training, including mind/body exercise modes such as yoga, pilates and tai chi.  Demonstration and skill practice provided.   Activity Barriers & Risk Stratification: Activity Barriers & Cardiac Risk Stratification - 03/21/19 1111      Activity Barriers & Cardiac Risk Stratification   Activity Barriers  Joint Problems;Other (comment);Deconditioning;Muscular Weakness;Shortness of Breath    Comments  occasional bilateral foot cramps on top of foot after being on feet for prolonged period of time (uses [employer] to treat pain)       6 Minute Walk: 6 Minute Walk    Row Name 03/22/19 0858         6 Minute  Walk   Phase  Initial     Distance  1200 feet     Walk Time  6 minutes     # of Rest Breaks  0     MPH  2.27     METS  2.33     RPE  15     Perceived Dyspnea   4     VO2 Peak  8.14     Symptoms  Yes (comment)     Comments  dizzy (improved with rest), back pain 6/10, SOB     Resting HR  75 bpm     Resting BP  114/66  Resting Oxygen Saturation   96 %     Exercise Oxygen Saturation  during 6 min walk  87 %     Max Ex. HR  117 bpm     Max Ex. BP  144/70     2 Minute Post BP  136/72       Interval HR   1 Minute HR  100     2 Minute HR  110     3 Minute HR  116     4 Minute HR  117     5 Minute HR  115     6 Minute HR  109     2 Minute Post HR  79     Interval Heart Rate?  Yes       Interval Oxygen   Interval Oxygen?  Yes     Baseline Oxygen Saturation %  96 %     1 Minute Oxygen Saturation %  88 %     1 Minute Liters of Oxygen  0 L Room Air     2 Minute Oxygen Saturation %  87 %     2 Minute Liters of Oxygen  0 L     3 Minute Oxygen Saturation %  88 %     3 Minute Liters of Oxygen  0 L     4 Minute Oxygen Saturation %  89 %     4 Minute Liters of Oxygen  0 L     5 Minute Oxygen Saturation %  87 %     5 Minute Liters of Oxygen  0 L     6 Minute Oxygen Saturation %  88 %     6 Minute Liters of Oxygen  0 L     2 Minute Post Oxygen Saturation %  94 %     2 Minute Post Liters of Oxygen  0 L       Oxygen Initial Assessment: Oxygen Initial Assessment - 03/21/19 1116      Home Oxygen   Home Oxygen Device  None    Sleep Oxygen Prescription  CPAP   just completed sleep study   Home Exercise Oxygen Prescription  None    Home at Rest Exercise Oxygen Prescription  None    Compliance with Home Oxygen Use  Yes      Initial 6 min Walk   Oxygen Used  None      Program Oxygen Prescription   Program Oxygen Prescription  None      Intervention   Short Term Goals  To learn and exhibit compliance with exercise, home and travel O2 prescription;To learn and understand  importance of monitoring SPO2 with pulse oximeter and demonstrate accurate use of the pulse oximeter.;To learn and understand importance of maintaining oxygen saturations>88%;To learn and demonstrate proper pursed lip breathing techniques or other breathing techniques.;To learn and demonstrate proper use of respiratory medications    Long  Term Goals  Exhibits compliance with exercise, home and travel O2 prescription;Verbalizes importance of monitoring SPO2 with pulse oximeter and return demonstration;Maintenance of O2 saturations>88%;Exhibits proper breathing techniques, such as pursed lip breathing or other method taught during program session;Compliance with respiratory medication;Demonstrates proper use of MDI's       Oxygen Re-Evaluation: Oxygen Re-Evaluation    Row Name 03/29/19 0823 04/17/19 1146           Program Oxygen Prescription   Program Oxygen Prescription  None  None  Home Oxygen   Home Oxygen Device  None  None      Sleep Oxygen Prescription  CPAP  CPAP      Home Exercise Oxygen Prescription  None  None      Home at Rest Exercise Oxygen Prescription  None  None      Compliance with Home Oxygen Use  Yes  Yes        Goals/Expected Outcomes   Short Term Goals  To learn and demonstrate proper pursed lip breathing techniques or other breathing techniques.;To learn and understand importance of monitoring SPO2 with pulse oximeter and demonstrate accurate use of the pulse oximeter.;To learn and understand importance of maintaining oxygen saturations>88%  To learn and demonstrate proper pursed lip breathing techniques or other breathing techniques.;To learn and understand importance of monitoring SPO2 with pulse oximeter and demonstrate accurate use of the pulse oximeter.;To learn and understand importance of maintaining oxygen saturations>88%      Long  Term Goals  Maintenance of O2 saturations>88%;Exhibits proper breathing techniques, such as pursed lip breathing or other  method taught during program session;Verbalizes importance of monitoring SPO2 with pulse oximeter and return demonstration  Maintenance of O2 saturations>88%;Exhibits proper breathing techniques, such as pursed lip breathing or other method taught during program session;Verbalizes importance of monitoring SPO2 with pulse oximeter and return demonstration      Comments  Short: Use RPE daily to regulate intensity. Long: Follow program prescription in THR.  Terri Hughes is compliant with her CPAP but is still having some bad breathing days.  She works on using her PLB those days and less active. She is using her pulse oximeter at home and has noted on these days that it drops to 87-84% but she has not told the doctor yet.  She has had sleep study with desaturation but not enough for oxygen therapy. She has noted that it drops with weight bearing activity and may benefit from oxygen.  We talked about talking with her doctor about it.      Goals/Expected Outcomes  Short: Become more profiecient at using PLB.   Long: Become independent at using PLB.  Short: Contact pulmonologist about sats  Long: Continue to use PLB and managing breathing.         Oxygen Discharge (Final Oxygen Re-Evaluation): Oxygen Re-Evaluation - 04/17/19 1146      Program Oxygen Prescription   Program Oxygen Prescription  None      Home Oxygen   Home Oxygen Device  None    Sleep Oxygen Prescription  CPAP    Home Exercise Oxygen Prescription  None    Home at Rest Exercise Oxygen Prescription  None    Compliance with Home Oxygen Use  Yes      Goals/Expected Outcomes   Short Term Goals  To learn and demonstrate proper pursed lip breathing techniques or other breathing techniques.;To learn and understand importance of monitoring SPO2 with pulse oximeter and demonstrate accurate use of the pulse oximeter.;To learn and understand importance of maintaining oxygen saturations>88%    Long  Term Goals  Maintenance of O2 saturations>88%;Exhibits  proper breathing techniques, such as pursed lip breathing or other method taught during program session;Verbalizes importance of monitoring SPO2 with pulse oximeter and return demonstration    Comments  Terri Hughes is compliant with her CPAP but is still having some bad breathing days.  She works on using her PLB those days and less active. She is using her pulse oximeter at home and has noted on these days  that it drops to 87-84% but she has not told the doctor yet.  She has had sleep study with desaturation but not enough for oxygen therapy. She has noted that it drops with weight bearing activity and may benefit from oxygen.  We talked about talking with her doctor about it.    Goals/Expected Outcomes  Short: Contact pulmonologist about sats  Long: Continue to use PLB and managing breathing.       Initial Exercise Prescription: Initial Exercise Prescription - 03/22/19 0900      Date of Initial Exercise RX and Referring Provider   Date  03/22/19    Referring Provider  Thad Ranger MD      Treadmill   MPH  1.7    Grade  0    Minutes  15    METs  2.3      NuStep   Level  1    SPM  80    Minutes  15    METs  2      Biostep-RELP   Level  1    SPM  50    Minutes  15    METs  2      Prescription Details   Frequency (times per week)  1    Duration  Progress to 30 minutes of continuous aerobic without signs/symptoms of physical distress      Intensity   THRR 40-80% of Max Heartrate  110-145    Ratings of Perceived Exertion  11-13    Perceived Dyspnea  0-4      Progression   Progression  Continue to progress workloads to maintain intensity without signs/symptoms of physical distress.      Resistance Training   Training Prescription  Yes    Weight  3 lbs    Reps  10-15       Perform Capillary Blood Glucose checks as needed.  Exercise Prescription Changes: Exercise Prescription Changes    Row Name 03/22/19 0900 04/02/19 1500 04/18/19 1000 05/01/19 1300 05/16/19 1200      Response to Exercise   Blood Pressure (Admit)  114/66  146/64  122/70  124/70  124/68   Blood Pressure (Exercise)  144/70  144/72  126/62  132/74  126/70   Blood Pressure (Exit)  124/70  132/80  118/58  110/66  116/62   Heart Rate (Admit)  75 bpm  71 bpm  88 bpm  70 bpm  82 bpm   Heart Rate (Exercise)  117 bpm  125 bpm  95 bpm  106 bpm  96 bpm   Heart Rate (Exit)  76 bpm  84 bpm  77 bpm  79 bpm  80 bpm   Oxygen Saturation (Admit)  96 %  --  96 %  98 %  95 %   Oxygen Saturation (Exercise)  87 %  --  94 %  93 %  93 %   Oxygen Saturation (Exit)  94 %  --  93 %  93 %  94 %   Rating of Perceived Exertion (Exercise)  _0 Perceived Dyspnea (Exercise)  _1 0   Symptoms  dizzy, back pain (6/10), SOB  --  SOB  --  none   Comments  walk test results  --  --  --  --   Duration  --  Continue with 30 min of aerobic exercise without signs/symptoms of physical distress.  Continue with 30 min of aerobic exercise without signs/symptoms of physical distress.  Progress to 30 minutes of  aerobic without signs/symptoms of physical distress  Continue with 30 min of aerobic exercise without signs/symptoms of physical distress.   Intensity  --  THRR unchanged  THRR unchanged  THRR unchanged  THRR unchanged     Progression   Progression  --  Continue to progress workloads to maintain intensity without signs/symptoms of physical distress.  Continue to progress workloads to maintain intensity without signs/symptoms of physical distress.  Continue to progress workloads to maintain intensity without signs/symptoms of physical distress.  Continue to progress workloads to maintain intensity without signs/symptoms of physical distress.   Average METs  --  2.7  2.23  2.1  2.17     Resistance Training   Training Prescription  --  Yes  Yes  Yes  Yes   Weight  --  3 lb  3 lb  3 lb  3 lb   Reps  --  10-15  10-15  10-15  10-15     Interval Training   Interval Training  --  --  No  No  No     Bike    Level  --  3  2.3  --  --   Minutes  --  15  15  --  --   METs  --  2.7  2.37  --  --     NuStep   Level  --  --  _0 SPM  --  --  --  80  --   Minutes  --  --  _1 METs  --  --  1.8  2.2  1.7     Arm Ergometer   Level  --  1  1  --  --   Minutes  --  15  15  --  --   METs  --  --  2.4  --  --     T5 Nustep   Level  --  --  --  --  3   Minutes  --  --  --  --  15   METs  --  --  --  --  2.1     Biostep-RELP   Level  --  --  1  2  --   SPM  --  --  --  50  --   Minutes  --  --  15  15  --   METs  --  --  2  2  --     Home Exercise Plan   Plans to continue exercise at  --  --  Home (comment) walking, staff videos  Home (comment) walking, staff videos  Home (comment) walking, staff videos   Frequency  --  --  Add 2 additional days to program exercise sessions.  Add 2 additional days to program exercise sessions.  Add 2 additional days to program exercise sessions.   Initial Home Exercises Provided  --  --  04/17/19  04/17/19  04/17/19   Row Name 05/29/19 1400             Response to Exercise   Blood Pressure (Admit)  128/80       Blood Pressure (Exercise)  158/64       Blood Pressure (Exit)  116/74       Heart Rate (Admit)  59 bpm  Heart Rate (Exercise)  102 bpm       Heart Rate (Exit)  74 bpm       Oxygen Saturation (Admit)  95 %       Oxygen Saturation (Exercise)  90 %       Oxygen Saturation (Exit)  94 %       Rating of Perceived Exertion (Exercise)  13       Perceived Dyspnea (Exercise)  3       Symptoms  none       Duration  Continue with 30 min of aerobic exercise without signs/symptoms of physical distress.       Intensity  THRR unchanged         Progression   Progression  Continue to progress workloads to maintain intensity without signs/symptoms of physical distress.       Average METs  2.5         Resistance Training   Training Prescription  Yes       Weight  3 lb       Reps  10-15         Interval Training   Interval Training   No         Treadmill   MPH  2.7       Grade  0       Minutes  15       METs  3.07         T5 Nustep   Level  3       SPM  80       Minutes  15       METs  2         Home Exercise Plan   Plans to continue exercise at  Home (comment) walking, staff videos       Frequency  Add 2 additional days to program exercise sessions.       Initial Home Exercises Provided  04/17/19          Exercise Comments: Exercise Comments    Row Name 03/29/19 [phone]           Exercise Comments  First full day of exercise!  Patient was oriented to gym and equipment including functions, settings, policies, and procedures.  Patient's individual exercise prescription and treatment plan were reviewed.  All starting workloads were established based on the results of the 6 minute walk test done at initial orientation visit.  The plan for exercise progression was also introduced and progression will be customized based on patient's performance and goals..          Exercise Goals and Review: Exercise Goals    Row Name 03/22/19 0904             Exercise Goals   Increase Physical Activity  Yes       Intervention  Provide advice, education, support and counseling about physical activity/exercise needs.;Develop an individualized exercise prescription for aerobic and resistive training based on initial evaluation findings, risk stratification, comorbidities and participant's personal goals.       Expected Outcomes  Short Term: Attend rehab on a regular basis to increase amount of physical activity.;Long Term: Add in home exercise to make exercise part of routine and to increase amount of physical activity.;Long Term: Exercising regularly at least 3-5 days a week.       Increase Strength and Stamina  Yes       Intervention  Provide advice, education, support and counseling  about physical activity/exercise needs.       Expected Outcomes  Short Term: Increase workloads from initial exercise prescription for  resistance, speed, and METs.;Short Term: Perform resistance training exercises routinely during rehab and add in resistance training at home;Long Term: Improve cardiorespiratory fitness, muscular endurance and strength as measured by increased METs and functional capacity (6MWT)       Able to understand and use rate of perceived exertion (RPE) scale  Yes       Intervention  Provide education and explanation on how to use RPE scale       Expected Outcomes  Short Term: Able to use RPE daily in rehab to express subjective intensity level;Long Term:  Able to use RPE to guide intensity level when exercising independently       Able to understand and use Dyspnea scale  Yes       Intervention  Provide education and explanation on how to use Dyspnea scale       Expected Outcomes  Short Term: Able to use Dyspnea scale daily in rehab to express subjective sense of shortness of breath during exertion;Long Term: Able to use Dyspnea scale to guide intensity level when exercising independently       Knowledge and understanding of Target Heart Rate Range (THRR)  Yes       Intervention  Provide education and explanation of THRR including how the numbers were predicted and where they are located for reference       Expected Outcomes  Short Term: Able to state/look up THRR;Short Term: Able to use daily as guideline for intensity in rehab;Long Term: Able to use THRR to govern intensity when exercising independently       Able to check pulse independently  Yes       Intervention  Provide education and demonstration on how to check pulse in carotid and radial arteries.;Review the importance of being able to check your own pulse for safety during independent exercise       Expected Outcomes  Short Term: Able to explain why pulse checking is important during independent exercise;Long Term: Able to check pulse independently and accurately       Understanding of Exercise Prescription  Yes       Intervention  Provide education,  explanation, and written materials on patient's individual exercise prescription       Expected Outcomes  Short Term: Able to explain program exercise prescription;Long Term: Able to explain home exercise prescription to exercise independently          Exercise Goals Re-Evaluation : Exercise Goals Re-Evaluation    Row Name 03/29/19 2229 04/17/19 1146 05/01/19 1338 05/01/19 1339 05/16/19 1218     Exercise Goal Re-Evaluation   Exercise Goals Review  Able to understand and use Dyspnea scale;Able to understand and use rate of perceived exertion (RPE) scale;Knowledge and understanding of Target Heart Rate Range (THRR);Able to check pulse independently;Understanding of Exercise Prescription  Increase Physical Activity;Increase Strength and Stamina;Understanding of Exercise Prescription  Increase Physical Activity;Increase Strength and Stamina;Able to understand and use rate of perceived exertion (RPE) scale;Able to understand and use Dyspnea scale;Knowledge and understanding of Target Heart Rate Range (THRR);Able to check pulse independently;Understanding of Exercise Prescription  --  Increase Physical Activity;Increase Strength and Stamina;Understanding of Exercise Prescription   Comments  Reviewed RPE scale, THR and program prescription with pt today.  Pt voiced understanding and was given a copy of goals to take home.  Terri Hughes is off to a good start  in rehab.  She is already walking 1-2 miles a day.  --  Terri Hughes is progressing well. She is at level 4 on NS and up to 2.7 mph walking.  Terri Hughes is doing well in rehab.  She has now also added in pelvic floor therapy into her exericse routine.  She had a fall over the weekend and is wearing a brace on her wrist.  She up to 20 watts on the recumbent bike now.  We will continue to monitor her progress.   Expected Outcomes  Short: Use RPE daily to regulate intensity. Long: Follow program prescription in THR.  Short: Continue to add in exercise at home.  Long: Continue to  improve stamina.  --  Short : continue to progress workloads Long: increase overall stamina  Short: Return to full exercise  Long: Continue to improve stamina   Row Name 05/24/19 1154             Exercise Goal Re-Evaluation   Exercise Goals Review  Increase Physical Activity;Increase Strength and Stamina;Understanding of Exercise Prescription       Comments  Terri Hughes is doing well in rehab.  She has now also added in pelvic floor therapy into her exericse routine. Pt is going to [employer] 5x/week (1.5 hours) and does weights everyday - she will rotate machines at [employer] and does resistance using the videos we have provided using 3 pound weights. Pt reports with rehab she is exercising 7x/week - encouraged pt that she is doing well, but to consider at least one rest day which could include light movement - Estill Bamberg also spoke to her during goals to address this; one day of rest doing light exercise like stretching and PT - pt reports that she feels like her breathing gets worse when she misses one day, Estill Bamberg discussed doing T5/T4 with light RPE so that her body can rest; pt reports getting enough sleep.  We will continue to monitor progress.       Expected Outcomes  Short: continue with exercise at home   Long: Continue to improve stamina          Discharge Exercise Prescription (Final Exercise Prescription Changes): Exercise Prescription Changes - 05/29/19 1400      Response to Exercise   Blood Pressure (Admit)  128/80    Blood Pressure (Exercise)  158/64    Blood Pressure (Exit)  116/74    Heart Rate (Admit)  59 bpm    Heart Rate (Exercise)  102 bpm    Heart Rate (Exit)  74 bpm    Oxygen Saturation (Admit)  95 %    Oxygen Saturation (Exercise)  90 %    Oxygen Saturation (Exit)  94 %    Rating of Perceived Exertion (Exercise)  13    Perceived Dyspnea (Exercise)  3    Symptoms  none    Duration  Continue with 30 min of aerobic exercise without signs/symptoms of physical distress.     Intensity  THRR unchanged      Progression   Progression  Continue to progress workloads to maintain intensity without signs/symptoms of physical distress.    Average METs  2.5      Resistance Training   Training Prescription  Yes    Weight  3 lb    Reps  10-15      Interval Training   Interval Training  No      Treadmill   MPH  2.7    Grade  0  Minutes  15    METs  3.07      T5 Nustep   Level  3    SPM  80    Minutes  15    METs  2      Home Exercise Plan   Plans to continue exercise at  Home (comment)   walking, staff videos   Frequency  Add 2 additional days to program exercise sessions.    Initial Home Exercises Provided  04/17/19       Nutrition:  Target Goals: Understanding of nutrition guidelines, daily intake of sodium <1531m, cholesterol <2047m calories 30% from fat and 7% or less from saturated fats, daily to have 5 or more servings of fruits and vegetables.  Education: Controlling Sodium/Reading Food Labels -Group verbal and written material supporting the discussion of sodium use in heart healthy nutrition. Review and explanation with models, verbal and written materials for utilization of the food label.   Education: General Nutrition Guidelines/Fats and Fiber: -Group instruction provided by verbal, written material, models and posters to present the general guidelines for heart healthy nutrition. Gives an explanation and review of dietary fats and fiber.   Biometrics: Pre Biometrics - 03/22/19 0905      Pre Biometrics   Height  5' 2.9" (1.598 m)    Weight  243 lb (110.2 kg)    BMI (Calculated)  43.16    Single Leg Stand  15.81 seconds        Nutrition Therapy Plan and Nutrition Goals: Nutrition Therapy & Goals - 04/05/19 1502      Nutrition Therapy   Diet  Low Na, HH diet    Protein (specify units)  90g    Fiber  25 grams    Whole Grain Foods  3 servings    Saturated Fats  12 max. grams    Fruits and Vegetables  5 servings/day     Sodium  1.5 grams      Personal Nutrition Goals   Nutrition Goal  ST: Add in more vegetables LT: pt wants to lose weight    Comments  trying to keep kcals 1500 and CHO at 150g. Uses boost protein shakes (AM with multivitamin). 2 low CHO tortillas with canadian bacon. piece of fruit. pears with low sugar syrup and cottage cheese or tu[place]andwich with white wheat. Pork skins snack or jerky (b/c low CHO). Pork chop, roasted vegetables, and corn. Sugar free jello. no sugar crystal light tea or water. Pt reports having IBS and doesn't like eggs, chicken, and a lot of vegetables. Talked about sneaking in some vegetables - ex: smoothie and spinach. Discussed HH eating, honoring hunger, and increased needs due to pumonary issues.      Intervention Plan   Intervention  Prescribe, educate and counsel regarding individualized specific dietary modifications aiming towards targeted core components such as weight, hypertension, lipid management, diabetes, heart failure and other comorbidities.;Nutrition handout(s) given to patient.    Expected Outcomes  Short Term Goal: Understand basic principles of dietary content, such as calories, fat, sodium, cholesterol and nutrients.;Short Term Goal: A plan has been developed with personal nutrition goals set during dietitian appointment.;Long Term Goal: Adherence to prescribed nutrition plan.       Nutrition Assessments: Nutrition Assessments - 03/22/19 0905      MEDFICTS Scores   Pre Score  62       MEDIFICTS Score Key:          ?70 Need to make dietary changes  40-70 Heart Healthy Diet         ? 40 Therapeutic Level Cholesterol Diet  Nutrition Goals Re-Evaluation: Nutrition Goals Re-Evaluation    [place] Name 05/08/19 1143 05/24/19 1201           Goals   Nutrition Goal  ST: Add in more vegetables LT: pt wants to lose weight  ST: Add in more vegetables LT: pt wants to lose weight      Comment  Pt reports being able to add 1 extra vegetable per  meal. Pt reports that she found a meal app that uses healthy recipes and gives you a grocery list for them as well. Pt reports this has helped her eat less fried foods.  Pt reports doing well and is using a ninja grill - cooking meat and vegetables in more HH ways. Getting a variety of colors. Pt reports doing well and would like to continue with what she is currently doing.      Expected Outcome  ST: Add in more vegetables LT: pt wants to lose weight  ST: Add in more vegetables LT: pt wants to lose weight         Nutrition Goals Discharge (Final Nutrition Goals Re-Evaluation): Nutrition Goals Re-Evaluation - 05/24/19 1201      Goals   Nutrition Goal  ST: Add in more vegetables LT: pt wants to lose weight    Comment  Pt reports doing well and is using a ninja grill - cooking meat and vegetables in more HH ways. Getting a variety of colors. Pt reports doing well and would like to continue with what she is currently doing.    Expected Outcome  ST: Add in more vegetables LT: pt wants to lose weight       Psychosocial: Target Goals: Acknowledge presence or absence of significant depression and/or stress, maximize coping skills, provide positive support system. Participant is able to verbalize types and ability to use techniques and skills needed for reducing stress and depression.   Education: Depression - Provides group verbal and written instruction on the correlation between heart/lung disease and depressed mood, treatment options, and the stigmas associated with seeking treatment.   Education: Sleep Hygiene -Provides group verbal and written instruction about how sleep can affect your health.  Define sleep hygiene, discuss sleep cycles and impact of sleep habits. Review good sleep hygiene tips.    Education: Stress and Anxiety: - Provides group verbal and written instruction about the health risks of elevated stress and causes of high stress.  Discuss the correlation between heart/lung  disease and anxiety and treatment options. Review healthy ways to manage with stress and anxiety.   Initial Review & Psychosocial Screening: Initial Psych Review & Screening - 03/21/19 1118      Initial Review   Current issues with  Current Depression;Current Anxiety/Panic;Current Stress Concerns    Source of Stress Concerns  Unable to participate in former interests or hobbies;Unable to perform yard/household activities;Retirement/disability;Financial;Chronic Illness    Comments  Currently applying for disability (first round was denied), has tried Xanax before, but not currently on it, anti-depressive make suicidal ideations worse, bowel and bladder incontenince (vaginal fault) and has appt with [facility]?  Yes   six children (three are local)   Comments  Three of her six children live in the area and has a granddaughter that lives with her to help out.      Barriers  Psychosocial barriers to participate in program  The patient should benefit from training in stress management and relaxation.;There are no identifiable barriers or psychosocial needs.      Screening Interventions   Interventions  Encouraged to exercise;Provide feedback about the scores to participant;To provide support and resources with identified psychosocial needs    Expected Outcomes  Long Term Goal: Stressors or current issues are controlled or eliminated.;Short Term goal: Identification and review with participant of any Quality of Life or Depression concerns found by scoring the questionnaire.;Long Term goal: The participant improves quality of Life and PHQ9 Scores as seen by post scores and/or verbalization of changes       Quality of Life Scores:  Scores of 19 and below usually indicate a poorer quality of life in these areas.  A difference of  2-3 points is a clinically meaningful difference.  A difference of 2-3 points in the total score of the Quality of Life Index has  been associated with significant improvement in overall quality of life, self-image, physical symptoms, and general health in studies assessing change in quality of life.  PHQ-9: Recent Review Flowsheet Data    Depression screen [facility] 2/9 05/03/2019 04/16/2019 03/22/2019   Decreased Interest _0 Down, Depressed, Hopeless _1 PHQ - 2 Score _2 Altered sleeping _3 Tired, decreased energy _4 Change in appetite 2 0 3   Feeling bad or failure about yourself  _5 Trouble concentrating _6 Moving slowly or fidgety/restless 2 0 0   Suicidal thoughts 0 0 0   PHQ-9 Score _7 Difficult doing work/chores Somewhat difficult Somewhat difficult Very difficult     Interpretation of Total Score  Total Score Depression Severity:  1-4 = Minimal depression, 5-9 = Mild depression, 10-14 = Moderate depression, 15-19 = Moderately severe depression, 20-27 = Severe depression   Psychosocial Evaluation and Intervention: Psychosocial Evaluation - 03/21/19 1129      Psychosocial Evaluation & Interventions   Interventions  Stress management education;Encouraged to exercise with the program and follow exercise prescription    Comments  Terri Hughes is coming to pulmonary rehab.  She is coming in for COPD and had a recent hospitalization for an exacerbation and hypoxia.  She has a significant history of depression and anxiety.  She feels that her depression is currently under control but her anxiety is starting to spiral.  In the past she was on xanax for anxiety and it worked and helped her sleep, but she was taken off of it.  She does not do well on anti-depression meds as they seem to make her suicidal ideations worse.  She is currently thinking about seeing someone at the [place] [facility] for help with her anxiety.  She is also hoping that the exercise will help as well.  She is looking forwad to the program.  She wants to learn to be comfortable with exercise and build her confidence back up.   She doesn't want to continue to worry about her heart and sats dropping when she exercises.  She will benefit from exercise from the strength and stamina side as well.  She also has issues with bowel and urinary incontenince that ofter leads to infections.  She is planning to see her OB-GYN about this and is considering pelvic floor therapy as well.    Expected Outcomes  Short: Attend  rehab regularly to build confidence in her ability to exercise.  Long: Better management of anxiety.    Continue Psychosocial Services   Follow up required by staff       Psychosocial Re-Evaluation: Psychosocial Re-Evaluation    [place] Name 04/16/19 [phone] 04/17/19 1153 05/24/19 1151         Psychosocial Re-Evaluation   Current issues with  Current Depression  Current Depression  Current Depression     Comments  PHQ 9 repeated  Score decreased 17 to 7 today.  Terri Hughes is doing better now that she is in rehab.  She is still getting frustrated with sats dropping at home, but we made a plan to start to talk to the doctor about it.  We also reviewed  COPD action plan to help manage breathing. Her depression is getting better overall.  Pt taking effexor 37.5 dose. Pt reports feeling better now on the new medication, but is still frustrated about her breathing and not having an O2 tank. Support system is children- Curator and mom live with her.     Expected Outcomes  STG continues decrease in PHQ 9 score as attends program and works on healing. LTG: Phq9 in normal range  Short: Continue to attend rehab and talk to doctor.  Long: Work on positive talk and self care.  Short: Continue to attend rehab and talk to doctor.  Long: Work on positive talk and self care.     Interventions  Encouraged to attend Pulmonary Rehabilitation for the exercise  Encouraged to attend Pulmonary Rehabilitation for the exercise;Stress management education;Relaxation education  Encouraged to attend Pulmonary Rehabilitation for the exercise;Stress  management education;Relaxation education     Continue Psychosocial Services   Follow up required by staff  Follow up required by staff  Follow up required by staff        Psychosocial Discharge (Final Psychosocial Re-Evaluation): Psychosocial Re-Evaluation - 05/24/19 1151      Psychosocial Re-Evaluation   Current issues with  Current Depression    Comments  Pt taking effexor 37.5 dose. Pt reports feeling better now on the new medication, but is still frustrated about her breathing and not having an O2 tank. Support system is children- Curator and mom live with her.    Expected Outcomes  Short: Continue to attend rehab and talk to doctor.  Long: Work on positive talk and self care.    Interventions  Encouraged to attend Pulmonary Rehabilitation for the exercise;Stress management education;Relaxation education    Continue Psychosocial Services   Follow up required by staff       Education: Education Goals: Education classes will be provided on a weekly basis, covering required topics. Participant will state understanding/return demonstration of topics presented.  Learning Barriers/Preferences: Learning Barriers/Preferences - 03/21/19 1113      Learning Barriers/Preferences   Learning Barriers  Reading;Sight   occasional "brain fog", glasses   Learning Preferences  Written Material       General Pulmonary Education Topics:  Infection Prevention: - Provides verbal and written material to individual with discussion of infection control including proper hand washing and proper equipment cleaning during exercise session.   Pulmonary Rehab from 03/22/2019 in [facility] Cardiac and Pulmonary Rehab  Date  03/22/19  Educator  [facility]  Instruction Review Code  1- Verbalizes Understanding      Falls Prevention: - Provides verbal and written material to individual with discussion of falls prevention and safety.   Pulmonary Rehab from 03/22/2019 in [facility] Cardiac and Pulmonary Rehab  Date   03/22/19  Educator  [facility]  Instruction Review Code  1- Verbalizes Understanding      Chronic Lung Diseases: - Group verbal and written instruction to review updates, respiratory medications, advancements in procedures and treatments. Discuss use of supplemental oxygen including available portable oxygen systems, continuous and intermittent flow rates, concentrators, personal use and safety guidelines. Review proper use of inhaler and spacers. Provide informative websites for self-education.    Energy Conservation: - Provide group verbal and written instruction for methods to conserve energy, plan and organize activities. Instruct on pacing techniques, use of adaptive equipment and posture/positioning to relieve shortness of breath.   Triggers and Exacerbations: - Group verbal and written instruction to review types of environmental triggers and ways to prevent exacerbations. Discuss weather changes, air quality and the benefits of nasal washing. Review warning signs and symptoms to help prevent infections. Discuss techniques for effective airway clearance, coughing, and vibrations.   AED/CPR: - Group verbal and written instruction with the use of models to demonstrate the basic use of the AED with the basic ABC's of resuscitation.   Anatomy and Physiology of the Lungs: - Group verbal and written instruction with the use of models to provide basic lung anatomy and physiology related to function, structure and complications of lung disease.   Anatomy & Physiology of the Heart: - Group verbal and written instruction and models provide basic cardiac anatomy and physiology, with the coronary electrical and arterial systems. Review of Valvular disease and Heart Failure   Cardiac Medications: - Group verbal and written instruction to review commonly prescribed medications for heart disease. Reviews the medication, class of the drug, and side effects.   Other: -Provides group and verbal  instruction on various topics (see comments)   Knowledge Questionnaire Score: Knowledge Questionnaire Score - 03/22/19 0906      Knowledge Questionnaire Score   Pre Score  16/18 Education Focus: Oxygen Safety        Core Components/Risk Factors/Patient Goals at Admission: Personal Goals and Risk Factors at Admission - 03/22/19 0906      Core Components/Risk Factors/Patient Goals on Admission    Weight Management  Yes;Obesity;Weight Loss    Intervention  Weight Management: Develop a combined nutrition and exercise program designed to reach desired caloric intake, while maintaining appropriate intake of nutrient and fiber, sodium and fats, and appropriate energy expenditure required for the weight goal.;Weight Management: Provide education and appropriate resources to help participant work on and attain dietary goals.;Weight Management/Obesity: Establish reasonable short term and long term weight goals.;Obesity: Provide education and appropriate resources to help participant work on and attain dietary goals.    Admit Weight  230 lb (104.3 kg)    Goal Weight: Short Term  225 lb (102.1 kg)    Goal Weight: Long Term  220 lb (99.8 kg)    Expected Outcomes  Short Term: Continue to assess and modify interventions until short term weight is achieved;Long Term: Adherence to nutrition and physical activity/exercise program aimed toward attainment of established weight goal;Weight Loss: Understanding of general recommendations for a balanced deficit meal plan, which promotes 1-2 lb weight loss per week and includes a negative energy balance of [id number] kcal/d;Understanding recommendations for meals to include 15-35% energy as protein, 25-35% energy from fat, 35-60% energy from carbohydrates, less than 257m of dietary cholesterol, 20-35 gm of total fiber daily;Understanding of distribution of calorie intake throughout the day with the consumption of 4-5 meals/snacks    Tobacco Cessation  Yes  Intervention  Advice worker, assist with locating and accessing local/national Quit Smoking programs, and support quit date choice.    Expected Outcomes  Short Term: Will quit all tobacco product use, adhering to prevention of relapse plan.;Long Term: Complete abstinence from all tobacco products for at least 12 months from quit date.    Improve shortness of breath with ADL's  Yes    Intervention  Provide education, individualized exercise plan and daily activity instruction to help decrease symptoms of SOB with activities of daily living.    Expected Outcomes  Short Term: Improve cardiorespiratory fitness to achieve a reduction of symptoms when performing ADLs;Long Term: Be able to perform more ADLs without symptoms or delay the onset of symptoms    Heart Failure  Yes    Intervention  Provide a combined exercise and nutrition program that is supplemented with education, support and counseling about heart failure. Directed toward relieving symptoms such as shortness of breath, decreased exercise tolerance, and extremity edema.    Expected Outcomes  Improve functional capacity of life;Short term: Attendance in program 2-3 days a week with increased exercise capacity. Reported lower sodium intake. Reported increased fruit and vegetable intake. Reports medication compliance.;Short term: Daily weights obtained and reported for increase. Utilizing diuretic protocols set by physician.;Long term: Adoption of self-care skills and reduction of barriers for early signs and symptoms recognition and intervention leading to self-care maintenance.       Education:Diabetes - Individual verbal and written instruction to review signs/symptoms of diabetes, desired ranges of glucose level fasting, after meals and with exercise. Acknowledge that pre and post exercise glucose checks will be done for 3 sessions at entry of program.   Education: Know Your Numbers and Risk Factors: -Group verbal and written  instruction about important numbers in your health.  Discussion of what are risk factors and how they play a role in the disease process.  Review of Cholesterol, Blood Pressure, Diabetes, and BMI and the role they play in your overall health.   Core Components/Risk Factors/Patient Goals Review:  Goals and Risk Factor Review    [place] Name 04/17/19 1155 05/24/19 1138           Core Components/Risk Factors/Patient Goals Review   Personal Goals Review  Weight Management/Obesity;Tobacco Cessation;Improve shortness of breath with ADL's;Hypertension  Weight Management/Obesity;Tobacco Cessation;Improve shortness of breath with ADL's;Hypertension      Review  Her weight was up recently, but she is trying to get it back down.  She has already started to take steps to improve it again and keep moving.  She is still doing well staying away from cigarettes.  Breathing is improving. and Pressures have been good.  Pt reports that she is still trying to get an O2 tank for exercise. She reports her O2 saturation drops below 90 and were 86 for 5 minutes of a 30 minute workout at her gym. She recently fell off of a treadmil at the gym when she forgot to put the safety on, pt reports that she is fine but hurt her left hand. Pt reports SOB has been the same and is waiting on her doctor to get back to her about the O2 tank. Pressures have been good. Pt reports doing very well with smoking cessation and reports "I can't believe I ever smoked".      Expected Outcomes  Short: Work on weight loss.  Long: Continue monitor risk factors.  ST: O2 tank, continue with work outside of the gym  LT: Continue  monitor risk factors.         Core Components/Risk Factors/Patient Goals at Discharge (Final Review):  Goals and Risk Factor Review - 05/24/19 1138      Core Components/Risk Factors/Patient Goals Review   Personal Goals Review  Weight Management/Obesity;Tobacco Cessation;Improve shortness of breath with ADL's;Hypertension     Review  Pt reports that she is still trying to get an O2 tank for exercise. She reports her O2 saturation drops below 90 and were 86 for 5 minutes of a 30 minute workout at her gym. She recently fell off of a treadmil at the gym when she forgot to put the safety on, pt reports that she is fine but hurt her left hand. Pt reports SOB has been the same and is waiting on her doctor to get back to her about the O2 tank. Pressures have been good. Pt reports doing very well with smoking cessation and reports "I can't believe I ever smoked".    Expected Outcomes  ST: O2 tank, continue with work outside of the gym  LT: Continue monitor risk factors.       ITP Comments: ITP Comments    Row Name 03/21/19 1140 03/22/19 0857 03/22/19 0858 03/29/19 0818 04/04/19 1429   ITP Comments  Completed virtual orientation today.  EP eval scheduled for 1/14 at 8am.  Documentation for diagnosis can be in [facility] encounter 01/03/19 and 02/28/19.  Completed 6MWT and gym orientation.  Initial ITP created and sent for review to Dr. Emily Filbert, Medical Director.  Lariya has recently quit tobacco use within the last 6 months. Intervention for relapse prevention was provided at the initial medical review. She was encouraged to continue to with tobacco cessation and was provided information on relapse prevention. Patient received information about combination therapy, tobacco cessation classes, quit line, and quit smoking apps in case of a relapse. Patient demonstrated understanding of this material.Staff will continue to provide encouragement and follow up with the patient throughout the program.  First full day of exercise!  Patient was oriented to gym and equipment including functions, settings, policies, and procedures.  Patient's individual exercise prescription and treatment plan were reviewed.  All starting workloads were established based on the results of the 6 minute walk test done at initial orientation visit.  The plan for exercise  progression was also introduced and progression will be customized based on patient's performance and goals..  30 day review completed. ITP sent to Dr. Emily Filbert, Medical Director of Cardiac and Pulmonary Rehab. Continue with ITP unless changes are made by physician.  Department operating under reduced schedule until further notice by request from hospital leadership.  New to program oriented 03/22/19.   [place] Name 04/05/19 1522 04/16/19 0842 05/02/19 0643 05/30/19 0652 06/07/19 1629   ITP Comments  Completed Initial RD Eval  PHQ 9 repeated  Score decreased 17 to 7 today.  30 day chart review completed. ITP sent to Dr Zachery Dakins Medical Director, for review,changes as needed and signature.  30 day chart review completed. ITP sent to Dr Zachery Dakins Medical Director, for review,changes as needed and signature. Continue with ITP if no changes requested  Terri Hughes will be out the next three weeks and will be out of town.   [place] Name 06/15/19 1305 06/27/19 1047 07/05/19 1418 07/25/19 0631 07/26/19 1456   ITP Comments  Terri Hughes is still out of town.  30 day chart review completed. ITP sent to Dr Zachery Dakins Medical Director, for review,changes as needed and signature.  Continue with ITP if no changes requested  Called to check on pt.  Last check in she was going to be out for three weeks due to a family circumstance.  She has continued to be out now for 4 weeks. Left message.  30 Day review completed. ITP review done, changes made as directed,and approval shown by signature of  Scientist, research (life sciences).  Terri Hughes has not returned to class nor returned our contact attempts.  We will discharge her at this time. Discharge ITP sent to Dr. Emily Filbert, medical director.      Comments: Discharge ITP

## 2019-10-07 ENCOUNTER — Telehealth: Payer: BC Managed Care – PPO | Admitting: Family

## 2019-10-07 DIAGNOSIS — J441 Chronic obstructive pulmonary disease with (acute) exacerbation: Secondary | ICD-10-CM

## 2019-10-07 MED ORDER — BENZONATATE 100 MG PO CAPS: 100.0000 mg | ORAL_CAPSULE | Freq: Three times a day (TID) | ORAL | 0 refills | Status: DC | PRN

## 2019-10-07 MED ORDER — PREDNISONE 10 MG (21) PO TBPK: ORAL_TABLET | ORAL | 0 refills | Status: AC

## 2019-10-07 NOTE — Progress Notes (Signed)

## 2019-10-29 ENCOUNTER — Telehealth: Payer: BC Managed Care – PPO | Admitting: Family

## 2019-10-29 DIAGNOSIS — A499 Bacterial infection, unspecified: Secondary | ICD-10-CM | POA: Diagnosis not present

## 2019-10-29 DIAGNOSIS — N390 Urinary tract infection, site not specified: Secondary | ICD-10-CM | POA: Diagnosis not present

## 2019-10-29 MED ORDER — NITROFURANTOIN MONOHYD MACRO 100 MG PO CAPS: 100.0000 mg | ORAL_CAPSULE | Freq: Two times a day (BID) | ORAL | 0 refills | Status: AC

## 2019-10-29 NOTE — Progress Notes (Signed)

## 2020-03-19 ENCOUNTER — Other Ambulatory Visit: Payer: Medicaid Other

## 2020-03-22 ENCOUNTER — Telehealth: Payer: Medicaid Other | Admitting: Nurse Practitioner

## 2020-03-22 DIAGNOSIS — U071 COVID-19: Secondary | ICD-10-CM

## 2020-03-22 MED ORDER — ALBUTEROL SULFATE HFA 108 (90 BASE) MCG/ACT IN AERS: 2.0000 | INHALATION_SPRAY | Freq: Four times a day (QID) | RESPIRATORY_TRACT | 0 refills | Status: AC | PRN

## 2020-03-22 MED ORDER — NAPROXEN 500 MG PO TABS: 500.0000 mg | ORAL_TABLET | Freq: Two times a day (BID) | ORAL | 0 refills | Status: AC

## 2020-03-22 MED ORDER — BENZONATATE 100 MG PO CAPS: 100.0000 mg | ORAL_CAPSULE | Freq: Three times a day (TID) | ORAL | 0 refills | Status: AC | PRN

## 2020-03-22 NOTE — Progress Notes (Signed)
E-Visit for Corona Virus Screening  We are sorry you are not feeling well. We are here to help!  You have tested positive for COVID-19, meaning that you were infected with the novel coronavirus and could give the virus to others.  It is vitally important that you stay home so you do not spread it to others.      Please continue isolation at home, for at least 10 days since the start of your symptoms and until you have had 24 hours with no fever (without taking a fever reducer) and with improving of symptoms.  If you have no symptoms but tested positive (or all symptoms resolve after 5 days and you have no fever) you can leave your house but continue to wear a mask around others for an additional 5 days. If you have a fever,continue to stay home until you have had 24 hours of no fever. Most cases improve 5-10 days from onset but we have seen a small number of patients who have gotten worse after the 10 days.  Please be sure to watch for worsening symptoms and remain taking the proper precautions.   Go to the nearest hospital ED for assessment if fever/cough/breathlessness are severe or illness seems like a threat to life.    The following symptoms may appear 2-14 days after exposure: . Fever . Cough . Shortness of breath or difficulty breathing . Chills . Repeated shaking with chills . Muscle pain . Headache . Sore throat . New loss of taste or smell . Fatigue . Congestion or runny nose . Nausea or vomiting . Diarrhea  You have been enrolled in [place] for COVID-19. Daily you will receive a questionnaire within the [facility] website. Our COVID-19 response team will be monitoring your responses daily.  You can use medication such as A prescription cough medication called Tessalon Perles 100 mg. You may take 1-2 capsules every 8 hours as needed for cough, A prescription inhaler called Albuterol MDI 90 mcg /actuation 2 puffs every 4 hours as needed for shortness of breath,  wheezing, cough and A prescription anti-inflammatory called Naprosyn 500 mg. Take twice daily as needed for fever or body aches for 2 weeks  You have tested positive for Covid but because you are not considered high risk you do not qualify for monoclonal antibody infusion.  Supportive care is all that is needed.   You may also take acetaminophen (Tylenol) as needed for fever.  HOME CARE: . Only take medications as instructed by your medical team. . Drink plenty of fluids and get plenty of rest. . A steam or ultrasonic humidifier can help if you have congestion.   GET HELP RIGHT AWAY IF YOU HAVE EMERGENCY WARNING SIGNS.  Call 911 or proceed to your closest emergency facility if: . You develop worsening high fever. . Trouble breathing . Bluish lips or face . Persistent pain or pressure in the chest . New confusion . Inability to wake or stay awake . You cough up blood. . Your symptoms become more severe . Inability to hold down food or fluids  This list is not all possible symptoms. Contact your medical provider for any symptoms that are severe or concerning to you.    Your e-visit answers were reviewed by a board certified advanced clinical practitioner to complete your personal care plan.  Depending on the condition, your plan could have included both over the counter or prescription medications.  If there is a problem please reply once you  have received a response from your provider.  Your safety is important to [place].  If you have drug allergies check your prescription carefully.    You can use MyChart to ask questions about today's visit, request a non-urgent call back, or ask for a work or school excuse for 24 hours related to this e-Visit. If it has been greater than 24 hours you will need to follow up with your provider, or enter a new e-Visit to address those concerns. You will get an e-mail in the next two days asking about your experience.  I hope that your e-visit has been  valuable and will speed your recovery. Thank you for using e-visits.   5-10 minutes spent reviewing and documenting in chart.

## 2020-04-16 ENCOUNTER — Other Ambulatory Visit: Payer: Self-pay | Admitting: Family Medicine

## 2020-04-16 DIAGNOSIS — Z1231 Encounter for screening mammogram for malignant neoplasm of breast: Secondary | ICD-10-CM

## 2020-04-28 ENCOUNTER — Ambulatory Visit: Payer: Medicaid Other

## 2020-05-01 ENCOUNTER — Other Ambulatory Visit: Payer: Self-pay | Admitting: Otolaryngology

## 2020-05-01 DIAGNOSIS — R221 Localized swelling, mass and lump, neck: Secondary | ICD-10-CM

## 2020-05-05 ENCOUNTER — Ambulatory Visit: Admission: RE | Admit: 2020-05-05 | Payer: BC Managed Care – PPO | Source: Ambulatory Visit

## 2020-05-05 ENCOUNTER — Other Ambulatory Visit: Payer: Self-pay

## 2020-05-05 ENCOUNTER — Ambulatory Visit
Admission: RE | Admit: 2020-05-05 | Discharge: 2020-05-05 | Disposition: A | Discharge status: Home / Self Care | Payer: BC Managed Care – PPO | Source: Ambulatory Visit | Attending: Otolaryngology | Admitting: Otolaryngology

## 2020-05-05 DIAGNOSIS — R221 Localized swelling, mass and lump, neck: Secondary | ICD-10-CM | POA: Diagnosis not present

## 2020-05-05 LAB — POCT I-STAT CREATININE: Creatinine, Ser: 0.8 mg/dL (ref 0.44–1.00)

## 2020-05-05 MED ORDER — IOHEXOL 300 MG/ML  SOLN
75.0000 mL | Freq: Once | INTRAMUSCULAR | Status: AC | PRN
  Administered 2020-05-05: 75 mL via INTRAVENOUS

## 2020-05-12 ENCOUNTER — Other Ambulatory Visit: Payer: Self-pay | Admitting: Otolaryngology

## 2020-05-12 DIAGNOSIS — L040 Acute lymphadenitis of face, head and neck: Secondary | ICD-10-CM

## 2020-05-14 ENCOUNTER — Ambulatory Visit
Admission: RE | Admit: 2020-05-14 | Discharge: 2020-05-14 | Disposition: A | Discharge status: Home / Self Care | Payer: BC Managed Care – PPO | Source: Ambulatory Visit | Attending: Family Medicine | Admitting: Family Medicine

## 2020-05-14 ENCOUNTER — Other Ambulatory Visit: Payer: Self-pay

## 2020-05-14 DIAGNOSIS — Z1231 Encounter for screening mammogram for malignant neoplasm of breast: Secondary | ICD-10-CM | POA: Diagnosis present

## 2020-05-15 ENCOUNTER — Inpatient Hospital Stay
Admission: RE | Admit: 2020-05-15 | Discharge: 2020-05-15 | Disposition: A | Discharge status: Home / Self Care | Payer: Self-pay | Source: Ambulatory Visit | Attending: *Deleted | Admitting: *Deleted

## 2020-05-15 ENCOUNTER — Other Ambulatory Visit: Payer: Self-pay | Admitting: *Deleted

## 2020-05-15 DIAGNOSIS — Z1231 Encounter for screening mammogram for malignant neoplasm of breast: Secondary | ICD-10-CM

## 2020-05-16 ENCOUNTER — Other Ambulatory Visit: Payer: Self-pay | Admitting: Student

## 2020-05-19 ENCOUNTER — Ambulatory Visit
Admission: RE | Admit: 2020-05-19 | Discharge: 2020-05-19 | Disposition: A | Discharge status: Home / Self Care | Payer: BC Managed Care – PPO | Source: Ambulatory Visit | Attending: Otolaryngology | Admitting: Otolaryngology

## 2020-05-19 ENCOUNTER — Other Ambulatory Visit: Payer: Self-pay

## 2020-05-19 ENCOUNTER — Ambulatory Visit: Payer: BC Managed Care – PPO

## 2020-05-19 DIAGNOSIS — C770 Secondary and unspecified malignant neoplasm of lymph nodes of head, face and neck: Secondary | ICD-10-CM | POA: Insufficient documentation

## 2020-05-19 DIAGNOSIS — L040 Acute lymphadenitis of face, head and neck: Secondary | ICD-10-CM | POA: Diagnosis present

## 2020-05-19 MED ORDER — SODIUM CHLORIDE 0.9 % IV SOLN: INTRAVENOUS | Status: DC

## 2020-05-19 MED ORDER — FENTANYL CITRATE (PF) 100 MCG/2ML IJ SOLN
INTRAMUSCULAR | Status: AC | PRN
Start: 2020-05-19 — End: 2020-05-19
  Administered 2020-05-19 (×2): 50 ug via INTRAVENOUS

## 2020-05-19 MED ORDER — FENTANYL CITRATE (PF) 100 MCG/2ML IJ SOLN
INTRAMUSCULAR | Status: AC
  Filled 2020-05-19: qty 2

## 2020-05-19 MED ORDER — MIDAZOLAM HCL 2 MG/2ML IJ SOLN
INTRAMUSCULAR | Status: AC | PRN
  Administered 2020-05-19 (×2): 1 mg via INTRAVENOUS

## 2020-05-19 MED ORDER — MIDAZOLAM HCL 2 MG/2ML IJ SOLN
INTRAMUSCULAR | Status: AC
  Filled 2020-05-19: qty 2

## 2020-05-19 NOTE — Discharge Instructions (Signed)
Needle Biopsy, Care After This sheet gives you information about how to care for yourself after your procedure. Your health care provider may also give you more specific instructions. If you have problems or questions, contact your health care provider. What can I expect after the procedure? After the procedure, it is common to have soreness, bruising, or mild pain at the puncture site. This should go away in a few days. Follow these instructions at home: Needle insertion site care  Wash your hands with soap and water before you change your bandage (dressing). If you cannot use soap and water, use hand sanitizer.  Follow instructions from your health care provider about how to take care of your puncture site. This includes: ? When and how to change your dressing. ? When to remove your dressing.  Check your puncture site every day for signs of infection. Check for: ? Redness, swelling, or pain. ? Fluid or blood. ? Pus or a bad smell. ? Warmth.   General instructions  Return to your normal activities as told by your health care provider. Ask your health care provider what activities are safe for you.  Do not take baths, swim, or use a hot tub until your health care provider approves. Ask your health care provider if you may take showers. You may only be allowed to take sponge baths.  Take over-the-counter and prescription medicines only as told by your health care provider.  Keep all follow-up visits as told by your health care provider. This is important. Contact a health care provider if:  You have a fever.  You have redness, swelling, or pain at the puncture site that lasts longer than a few days.  You have fluid, blood, or pus coming from your puncture site.  Your puncture site feels warm to the touch. Get help right away if:  You have severe bleeding from the puncture site. Summary  After the procedure, it is common to have soreness, bruising, or mild pain at the puncture  site. This should go away in a few days.  Check your puncture site every day for signs of infection, such as redness, swelling, or pain.  Get help right away if you have severe bleeding from your puncture site. This information is not intended to replace advice given to you by your health care provider. Make sure you discuss any questions you have with your health care provider. Document Revised: 08/23/2019 Document Reviewed: 08/23/2019 Elsevier Patient Education  2021 Elsevier Inc.  

## 2020-05-19 NOTE — Procedures (Signed)
Pre Procedure Dx: Right cervical lymphadenopathy °Post Procedural Dx: Same ° °Technically successful US guided biopsy of indeterminate right cervical lymph node. ° °EBL: None ° °No immediate complications.  ° °Jay Baya Lentz, MD °Pager #: [phone] ° ° ° °

## 2020-05-19 NOTE — Consult Note (Signed)
Chief Complaint: Right cervical lymphadenopathy  Referring Physician(s): Juengel,Paul  Patient Status: [facility] - Out-pt  History of Present Illness: Terri Hughes is a 60 y.o. female with past medical history significant for asthma and CHF who presents today for ultrasound-guided biopsy of right cervical lymphadenopathy demonstrated on neck CT performed 05/05/2020 performed for the evaluation of palpable right neck mass.  Patient is currently without complaint.  Specifically, no unintentional weight loss or weight gain.  No change in appetite or energy level.  No chest pain, shortness of breath, fever or chills.  Past Medical History:  Diagnosis Date  . Asthma   . CHF (congestive heart failure) ([place])     Past Surgical History:  Procedure Laterality Date  . ABDOMINAL HYSTERECTOMY    . APPENDECTOMY    . CESAREAN SECTION      Allergies: Hydrocodone, Codeine, Amitriptyline hcl, Gabapentin, and Oxycodone  Medications: Prior to Admission medications   Medication Sig Start Date End Date Taking? Authorizing Provider  albuterol (VENTOLIN HFA) 108 (90 Base) MCG/ACT inhaler Inhale 2 puffs into the lungs every 6 (six) hours as needed for wheezing or shortness of breath. 03/22/20   Hassell Done, Mary-Margaret, FNP  benzonatate (TESSALON PERLES) 100 MG capsule Take 1 capsule (100 mg total) by mouth 3 (three) times daily as needed. 03/22/20   Hassell Done Mary-Margaret, FNP  budesonide-formoterol (SYMBICORT) 160-4.5 MCG/ACT inhaler Inhale 2 puffs into the lungs daily.     [provider]  montelukast (SINGULAIR) 10 MG tablet Take 10 mg by mouth at bedtime.     [provider]  naproxen (NAPROSYN) 500 MG tablet Take 1 tablet (500 mg total) by mouth 2 (two) times daily with a meal. 03/22/20   Hassell Done, Mary-Margaret, FNP  nitrofurantoin, macrocrystal-monohydrate, (MACROBID) 100 MG capsule Take 1 capsule (100 mg total) by mouth 2 (two) times daily. 10/29/19   Kennyth Arnold, FNP   omeprazole (PRILOSEC) 20 MG capsule Take 20 mg by mouth daily.     [provider]  predniSONE (STERAPRED UNI-PAK 21 TAB) 10 MG (21) TBPK tablet Use as directed 10/07/19   Evelina Dun A, FNP  tiotropium (SPIRIVA) 18 MCG inhalation capsule Place into inhaler and inhale. 03/07/19 03/06/20  [provider]  venlafaxine (EFFEXOR) 37.5 MG tablet Take 37.5 mg by mouth daily.    [provider]     Family History  Problem Relation Age of Onset  . Uterine cancer Maternal Aunt   . Kidney cancer Maternal Aunt   . Lung cancer Maternal Aunt   . Colon cancer Maternal Grandmother   . Breast cancer Neg Hx     Social History   Socioeconomic History  . Marital status: Divorced    Spouse name: Not on file  . Number of children: Not on file  . Years of education: Not on file  . Highest education level: Not on file  Occupational History  . Not on file  Tobacco Use  . Smoking status: Former Smoker    Packs/day: 3.00    Years: 47.00    Pack years: 141.00    Types: Cigarettes    Start date: 04/18/1971    Quit date: 01/02/2019    Years since quitting: 1.3  . Smokeless tobacco: Never Used  Vaping Use  . Vaping Use: Never used  Substance and Sexual Activity  . Alcohol use: No  . Drug use: No  . Sexual activity: Not Currently  Other Topics Concern  . Not on file  Social History Narrative  . Not on file   Social Determinants of Health   Financial Resource Strain: Not on file  Food Insecurity: Not on file  Transportation Needs: Not on file  Physical Activity: Not on file  Stress: Not on file  Social Connections: Not on file    ECOG Status: 1 - Symptomatic but completely ambulatory  Review of Systems: A 12 point ROS discussed and pertinent positives are indicated in the HPI above.  All other systems are negative.  Review of Systems  Vital Signs: There were no vitals taken for this visit.  Physical Exam  Imaging: CT SOFT TISSUE NECK W  CONTRAST  Result Date: 05/05/2020 CLINICAL DATA:  Right neck mass EXAM: CT NECK WITH CONTRAST TECHNIQUE: Multidetector CT imaging of the neck was performed using the standard protocol following the bolus administration of intravenous contrast. CONTRAST:  21mL OMNIPAQUE IOHEXOL 300 MG/ML  SOLN COMPARISON:  None. FINDINGS: Pharynx and larynx: Incidental calcifications of the palatine tonsils reflecting prior inflammation. Otherwise unremarkable. No mass or swelling. Salivary glands: Unremarkable. Thyroid: 1 cm nodule of the isthmus. No ultrasound follow-up is recommended by current guidelines. Lymph nodes: Small, likely reactive lymph nodes in the area inflammation described below. Mildly enlarged pretracheal lymph node measuring 1.4 cm short axis just above level of carina. Vascular: Major neck vessels are patent. There is calcified plaque at the ICA origins. Limited intracranial: No abnormal enhancement. Visualized orbits: Unremarkable. Mastoids and visualized paranasal sinuses: Aerated. Skeleton: Patchy foci of sclerosis involving several vertebral bodies and other included osseous structures. Upper chest: No apical lung mass. Other: Infiltration of right supraclavicular fat and posterior triangle of the neck. Overlying skin marker is present. No evidence of abscess. IMPRESSION: Inflammatory changes in the right posterior triangle and right supraclavicular fat without evidence of abscess. Patchy foci of osseous sclerosis. Nonspecific mildly enlarged pretracheal lymph node. Bone scan recommended. Consider chest CT as well given smoking history. Electronically Signed   By: Macy Mis M.D.   On: 05/05/2020 12:09   MM 3D SCREEN BREAST BILATERAL  Addendum Date: 05/19/2020   ADDENDUM REPORT: 05/19/2020 07:53 ADDENDUM: Outside chest CT from 06/05/19 was submitted and an addendum was requested. Assessment of 05/14/20 mammogram is unchanged. Recommendation: Recommend bilateral diagnostic mammogram with ultrasound as  deemed necessary. BI-RADS: 0: Incomplete. Need additional imaging evaluation and/or prior mammograms for comparison. Electronically Signed   By: Valentino Saxon MD   On: 05/19/2020 07:53   Result Date: 05/19/2020 CLINICAL DATA:  Screening. EXAM: DIGITAL SCREENING BILATERAL MAMMOGRAM WITH TOMOSYNTHESIS AND CAD TECHNIQUE: Bilateral screening digital craniocaudal and mediolateral oblique mammograms were obtained. Bilateral screening digital breast tomosynthesis was performed. The images were evaluated with computer-aided detection. COMPARISON:  None. ACR Breast Density Category b: There are scattered areas of fibroglandular density. FINDINGS: In the right breast multiple focal asymmetries with associated architectural distortion and calcifications warrant further evaluation. In the left breast several focal asymmetries and calcifications warrant requires further evaluation. IMPRESSION: Further evaluation is suggested for possible focal asymmetries and calcifications in the right breast. Further evaluation is suggested for possible focal asymmetries and calcifications in the left breast. RECOMMENDATION: Diagnostic mammogram and possibly ultrasound of both breasts. (Code:[id number]) The patient will be contacted regarding the findings, and additional imaging will be scheduled. BI-RADS CATEGORY  0: Incomplete. Need additional imaging evaluation and/or prior mammograms for comparison. Electronically Signed: By: Valentino Saxon MD On: 05/14/2020 16:54   MM Outside Films Mammo  Result Date: 05/16/2020 This examination belongs to an outside  facility and is stored here for comparison purposes only.  Contact the originating outside institution for any associated report or interpretation.   Labs:  CBC: No results for input(s): WBC, HGB, HCT, PLT in the last 8760 hours.  COAGS: No results for input(s): INR, APTT in the last 8760 hours.  BMP: Recent Labs    05/05/20 1130  CREATININE 0.80    LIVER  FUNCTION TESTS: No results for input(s): BILITOT, AST, ALT, ALKPHOS, PROT, ALBUMIN in the last 8760 hours.  TUMOR MARKERS: No results for input(s): AFPTM, CEA, CA199, CHROMGRNA in the last 8760 hours.  Assessment and Plan:  Terri Hughes is a 60 y.o. female with past medical history significant for asthma and CHF who presents today for ultrasound-guided biopsy of right cervical lymphadenopathy demonstrated on neck CT performed 05/05/2020 performed for the evaluation of palpable right neck mass.  Patient is currently without complaint.    Risks and benefits of US guided right cervical lymph node biopsy was discussed with the patient and/or patient's family including, but not limited to bleeding, infection, damage to adjacent structures or low yield requiring additional tests.  All of the questions were answered and there is agreement to proceed.  Consent signed and in chart.  Thank you for this interesting consult.  I greatly enjoyed meeting Terri Hughes and look forward to participating in their care.  A copy of this report was sent to the requesting provider on this date.  Electronically Signed: Sandi Mariscal, MD 05/19/2020, 1:29 PM   I spent a total of 15 Minutes in face to face in clinical consultation, greater than 50% of which was counseling/coordinating care for palpable right cervical lymphadenopathy.

## 2020-05-21 ENCOUNTER — Other Ambulatory Visit: Payer: Self-pay | Admitting: Family Medicine

## 2020-05-21 DIAGNOSIS — R921 Mammographic calcification found on diagnostic imaging of breast: Secondary | ICD-10-CM

## 2020-05-21 DIAGNOSIS — R928 Other abnormal and inconclusive findings on diagnostic imaging of breast: Secondary | ICD-10-CM

## 2020-05-21 DIAGNOSIS — N6489 Other specified disorders of breast: Secondary | ICD-10-CM

## 2020-05-26 ENCOUNTER — Other Ambulatory Visit: Payer: Self-pay

## 2020-05-26 ENCOUNTER — Other Ambulatory Visit: Payer: Self-pay | Admitting: Anatomic Pathology & Clinical Pathology

## 2020-05-26 ENCOUNTER — Ambulatory Visit
Admission: RE | Admit: 2020-05-26 | Discharge: 2020-05-26 | Disposition: A | Discharge status: Home / Self Care | Payer: BC Managed Care – PPO | Source: Ambulatory Visit | Attending: Family Medicine | Admitting: Family Medicine

## 2020-05-26 DIAGNOSIS — R921 Mammographic calcification found on diagnostic imaging of breast: Secondary | ICD-10-CM

## 2020-05-26 DIAGNOSIS — R928 Other abnormal and inconclusive findings on diagnostic imaging of breast: Secondary | ICD-10-CM

## 2020-05-26 DIAGNOSIS — N6489 Other specified disorders of breast: Secondary | ICD-10-CM | POA: Diagnosis present

## 2020-05-28 ENCOUNTER — Telehealth: Payer: Self-pay | Admitting: *Deleted

## 2020-05-28 ENCOUNTER — Other Ambulatory Visit: Payer: Self-pay | Admitting: Family Medicine

## 2020-05-28 DIAGNOSIS — R921 Mammographic calcification found on diagnostic imaging of breast: Secondary | ICD-10-CM

## 2020-05-28 DIAGNOSIS — N6310 Unspecified lump in the right breast, unspecified quadrant: Secondary | ICD-10-CM

## 2020-05-28 DIAGNOSIS — R928 Other abnormal and inconclusive findings on diagnostic imaging of breast: Secondary | ICD-10-CM

## 2020-05-28 NOTE — Telephone Encounter (Signed)
CALLED PT TO [facility] - PT STATED SHE IS WAITING FOR AN ONCOLOGY APPT @ [facility].  SHE WANTS TO SPEAK TO THE ONCOLOGIST BEFORE PROCEEDING WITH BX - SHE WCB IF/WHEN SHE IS READY TO [facility]

## 2020-05-29 ENCOUNTER — Encounter: Payer: Self-pay | Admitting: *Deleted

## 2020-05-29 NOTE — Progress Notes (Signed)
Patient's case was discussed in case conference today.  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  Patient does not have a medical oncologist or surgeon.  Called patient to establish navigation services.  Patient states she is not going to have her breast biopsy here.  States she has an appointment at [facility] oncology on Thursday.  Patient was given my office number to call if she chooses to return to [facility] for her care.

## 2020-06-02 LAB — SURGICAL PATHOLOGY

## 2020-06-24 ENCOUNTER — Telehealth: Payer: BC Managed Care – PPO | Admitting: Emergency Medicine

## 2020-06-24 DIAGNOSIS — R300 Dysuria: Secondary | ICD-10-CM

## 2020-06-24 MED ORDER — CEPHALEXIN 500 MG PO CAPS: 500.0000 mg | ORAL_CAPSULE | Freq: Two times a day (BID) | ORAL | 0 refills | Status: AC

## 2020-06-24 NOTE — Progress Notes (Signed)

## 2020-08-07 ENCOUNTER — Encounter: Payer: Self-pay | Admitting: Hematology and Oncology

## 2021-02-10 IMAGING — DX DG CHEST 1V PORT
1 series · 1 of 1 positions shown · non-contrast
Comparison: Radiograph August 27, 2016

CLINICAL DATA: Cough, congestion and fever

EXAM:
PORTABLE CHEST 1 VIEW

[chest ap]
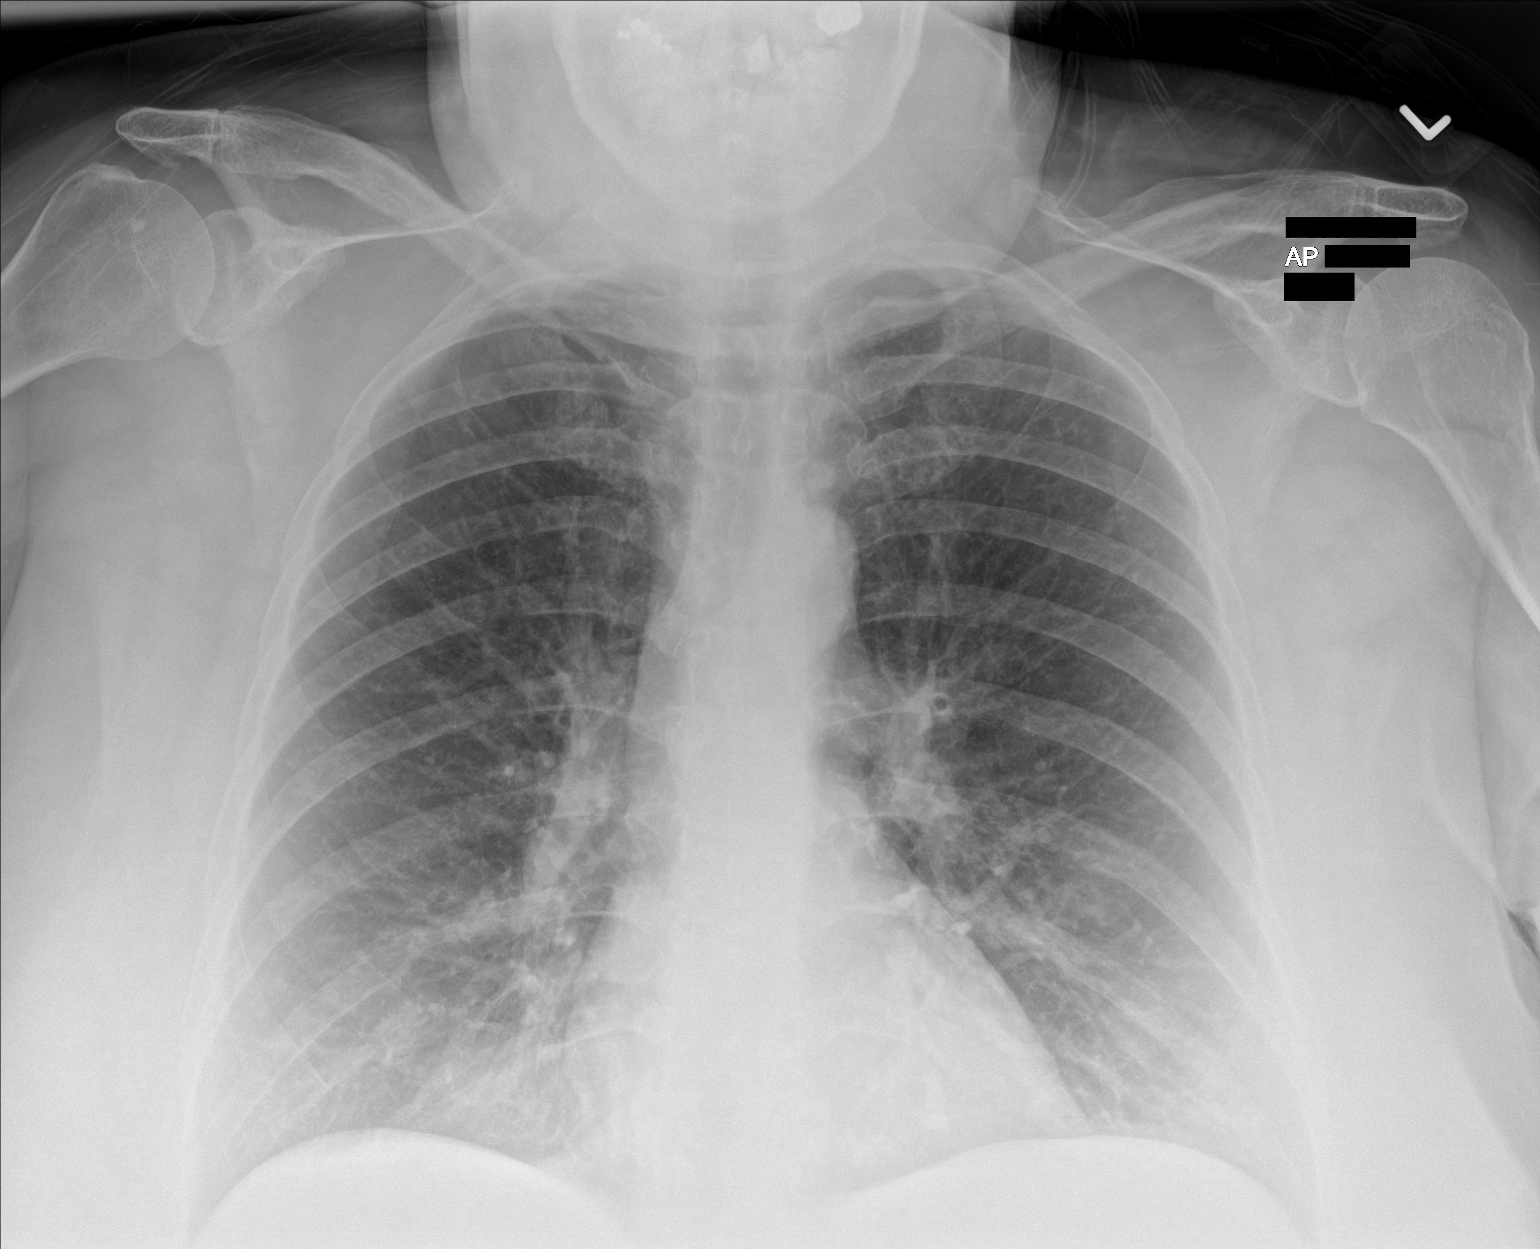

[1 of 1 positions shown; findings below may reference images not displayed]

FINDINGS: Coarse basilar interstitial changes and airways thickening are
similar to comparisons. No consolidation, features of edema,
pneumothorax, or effusion. The cardiomediastinal contours are
unremarkable. No acute osseous abnormality. Degenerative changes are
present in the imaged shoulders.
IMPRESSION: Chronic bronchitic and interstitial changes. No acute
cardiopulmonary abnormality.

## 2022-07-04 IMAGING — MG DIGITAL DIAGNOSTIC BILAT W/ TOMO W/ CAD
5 of 18 series · 5 of 40 positions shown · non-contrast
Comparison: Previous exam(s).
COMPARISON: Previous exam(s).

Addendum:
CLINICAL DATA: 59-year-old female presenting as a recall from
screening for multiple bilateral abnormalities. Patient recently had
a right supraclavicular lymph node biopsied demonstrating metastatic
breast cancer.



[R ML]
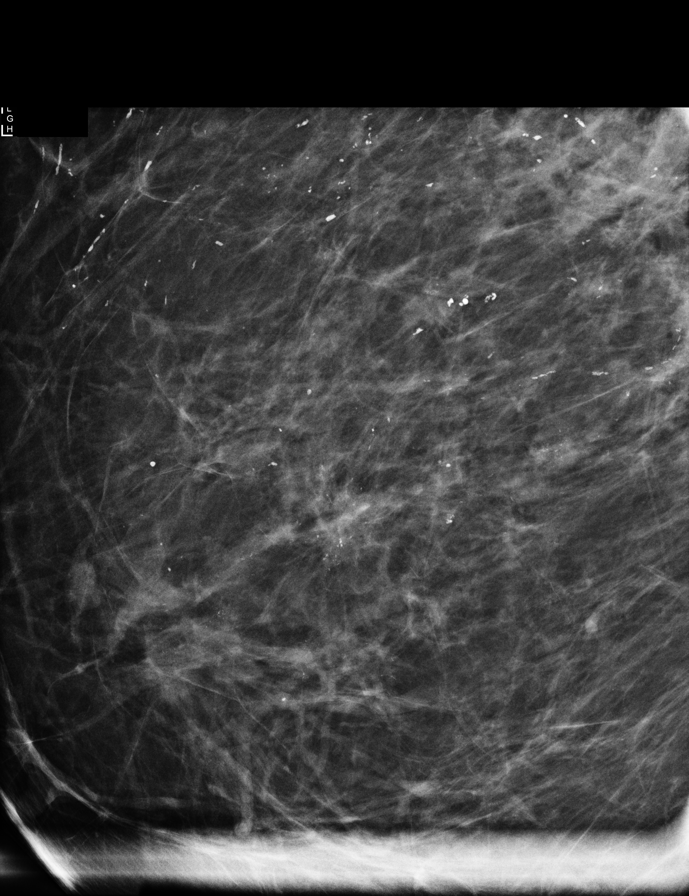

[L CC]
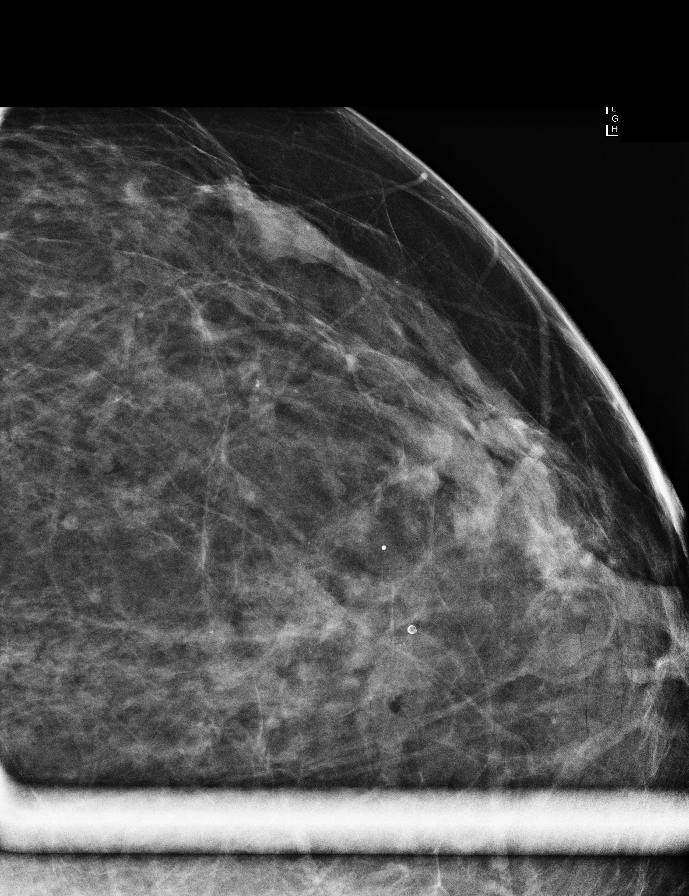

[R CC (1 of 2)]
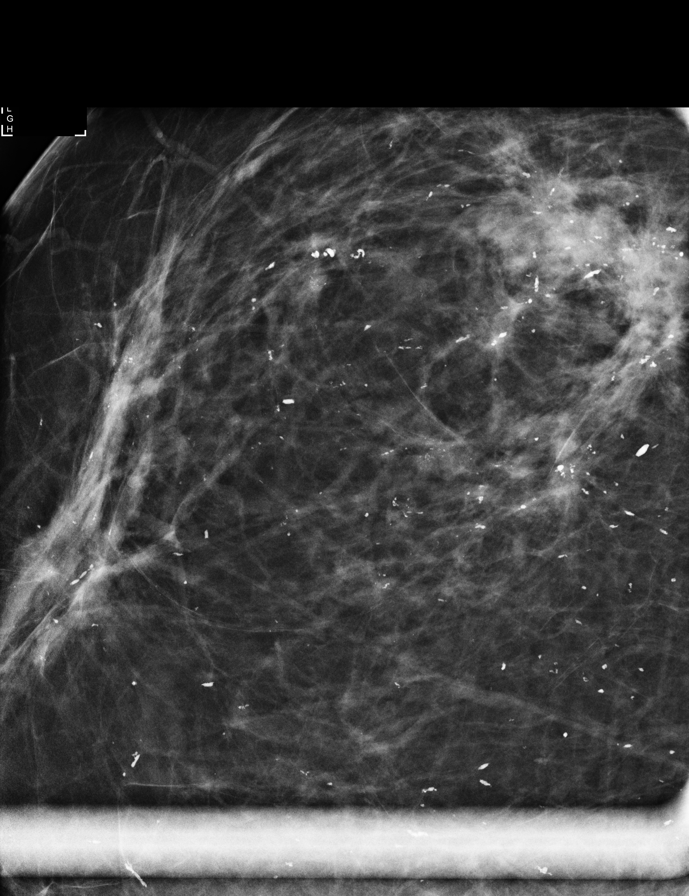

[R CC (2 of 2)]
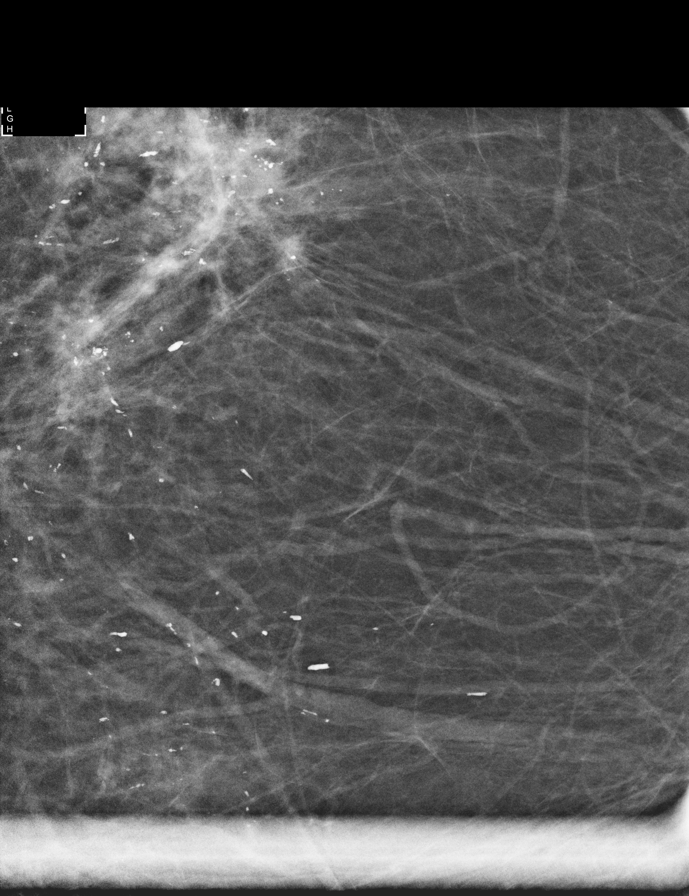

[L ML]
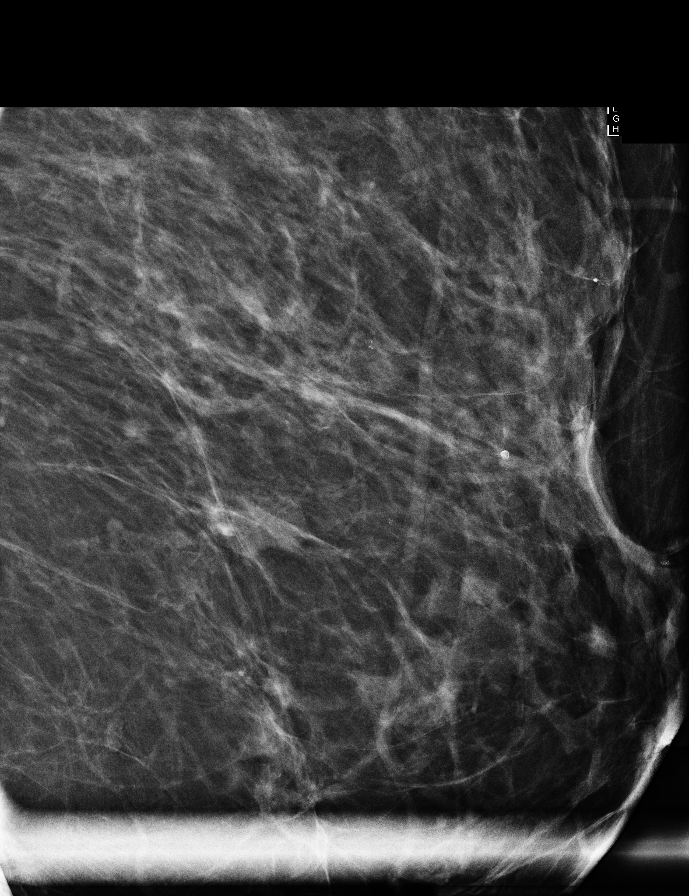

[5 of 40 positions shown; findings below may reference images not displayed]

ACR Breast Density Category b: There are scattered areas of
fibroglandular density.
FINDINGS: Mammogram:

Right breast: Spot compression tomosynthesis, spot magnification and
full field mL tomosynthesis views of the right breast were
performed. There is extensive abnormality predominantly involving
the upper outer quadrant of the right breast including suspicious
pleomorphic calcifications with linear forms spanning at least 10 x
12 cm. There are multiple areas of distortion in the upper outer
right breast, the two most dominant areas are separated by a
distance of 8 cm. The anterior distortion is located in the outer
subareolar aspect of the breast and the posterior distortion in the
upper outer breast. There is also a small mass that persists in the
medial aspect of the right breast anterior depth measuring 5 mm.
Additional possible small mass measuring 6 mm is seen in the
superior central breast middle depth on the spot cc view.

Left breast: Spot 2D magnification views and spot compression
tomosynthesis views as well as full field mL tomosynthesis views of
the left breast were performed. There is persistence of an oval
circumscribed mass in the outer left breast measuring 1.5 cm. There
are multiple scattered small groups of faint calcifications with
similar morphology in the upper central and upper outer left breast.
No suspicious associated mass or distortion.

Ultrasound:

Right breast:

Targeted ultrasound is performed in the right breast at 2:30 o'clock
3 cm from the nipple demonstrating an oval circumscribed hypoechoic
mass measuring 0.6 x 0.4 x 0.5 cm. No internal vascularity. This
corresponds to the small mass identified in the medial breast
mammographically. The additional possible small mass seen on
mammogram in the superior central right breast is not definitely
visualized. In the right breast at 9 o'clock 2 cm from the nipple
there is an irregular hypoechoic mass measuring 1.9 x 0.8 x 0.8 cm.
This likely corresponds to the subareolar area of distortion. At 10
o'clock 12 cm from nipple there is a large hypoechoic ill-defined
mass measuring at least 2.9 x 2.1 x 1.7 cm. This likely corresponds
to the second dominant area of distortion identified
mammographically. Nearby at 11 o'clock 12 cm from nipple there is
another similar irregular hypoechoic mass measuring 1.1 x 0.4 x
cm. Targeted ultrasound of the right axilla demonstrates 2 abnormal
lymph nodes with cortical thickness measuring up to 0.5 cm.

Left breast:

Targeted ultrasound of the left breast at 3 o'clock 6 cm from nipple
demonstrates an oval circumscribed anechoic mass measuring 1.6 x
x 1.0 cm, consistent with a benign simple cyst or focally dilated
duct. No suspicious solid component. This corresponds to the mass
identified mammographically. Targeted ultrasound of the left axilla
demonstrates two adjacent abnormal lymph nodes with cortical
thickening measuring up to 0.6 cm.
IMPRESSION: 1. Extensive abnormality throughout of the upper-outer quadrant of
the right breast including multiple areas of distortion and masses
as well as suspicious calcifications spanning 10 x 12 cm.

2. Additional small indeterminate mass in the medial aspect at 2:30
o'clock.

3.  Right axillary adenopathy.

4. Benign cyst or focally dilated duct in the left breast at 3
o'clock.

5. Indeterminate multiple small groups of calcifications in the
upper central and upper outer left breast.

6.  Left axillary adenopathy.

RECOMMENDATION:
1. Ultrasound-guided core needle biopsy x 4 of the right breast and
axilla. Recommend targeting the breast masses at 2:30 o'clock, 9
o'clock and 10 o'clock.

2. Stereotactic core needle biopsy x 2 of the left breast, targeting
a group of calcifications in the central breast and a group in the
outer breast.

3. Ultrasound-guided core needle biopsy of a left axillary lymph
node.

4. Depending on the results of the above biopsies, there are
additional sites in the right breast at could be targeted for extent
of disease. If the left breast biopsies return benign, consider
breast MRI before proceeding to surgical management of the right
breast.

I have discussed the findings and recommendations with the patient
who agrees to proceed with biopsies. Will attempt to biopsy the
right breast on one day and the left breast on a separate day.

BI-RADS CATEGORY  5: Highly suggestive of malignancy.

ADDENDUM:
Upon further review, biopsy of the suspicious bilateral lymph nodes
will be deferred unless becomes clinically indicated for
treatment/surgical planning.

*** End of Addendum ***
ACR Breast Density Category b: There are scattered areas of
fibroglandular density.
FINDINGS: Mammogram:

Right breast: Spot compression tomosynthesis, spot magnification and
full field mL tomosynthesis views of the right breast were
performed. There is extensive abnormality predominantly involving
the upper outer quadrant of the right breast including suspicious
pleomorphic calcifications with linear forms spanning at least 10 x
12 cm. There are multiple areas of distortion in the upper outer
right breast, the two most dominant areas are separated by a
distance of 8 cm. The anterior distortion is located in the outer
subareolar aspect of the breast and the posterior distortion in the
upper outer breast. There is also a small mass that persists in the
medial aspect of the right breast anterior depth measuring 5 mm.
Additional possible small mass measuring 6 mm is seen in the
superior central breast middle depth on the spot cc view.

Left breast: Spot 2D magnification views and spot compression
tomosynthesis views as well as full field mL tomosynthesis views of
the left breast were performed. There is persistence of an oval
circumscribed mass in the outer left breast measuring 1.5 cm. There
are multiple scattered small groups of faint calcifications with
similar morphology in the upper central and upper outer left breast.
No suspicious associated mass or distortion.

Ultrasound:

Right breast:

Targeted ultrasound is performed in the right breast at 2:30 o'clock
3 cm from the nipple demonstrating an oval circumscribed hypoechoic
mass measuring 0.6 x 0.4 x 0.5 cm. No internal vascularity. This
corresponds to the small mass identified in the medial breast
mammographically. The additional possible small mass seen on
mammogram in the superior central right breast is not definitely
visualized. In the right breast at 9 o'clock 2 cm from the nipple
there is an irregular hypoechoic mass measuring 1.9 x 0.8 x 0.8 cm.
This likely corresponds to the subareolar area of distortion. At 10
o'clock 12 cm from nipple there is a large hypoechoic ill-defined
mass measuring at least 2.9 x 2.1 x 1.7 cm. This likely corresponds
to the second dominant area of distortion identified
mammographically. Nearby at 11 o'clock 12 cm from nipple there is
another similar irregular hypoechoic mass measuring 1.1 x 0.4 x
cm. Targeted ultrasound of the right axilla demonstrates 2 abnormal
lymph nodes with cortical thickness measuring up to 0.5 cm.

Left breast:

Targeted ultrasound of the left breast at 3 o'clock 6 cm from nipple
demonstrates an oval circumscribed anechoic mass measuring 1.6 x
x 1.0 cm, consistent with a benign simple cyst or focally dilated
duct. No suspicious solid component. This corresponds to the mass
identified mammographically. Targeted ultrasound of the left axilla
demonstrates two adjacent abnormal lymph nodes with cortical
thickening measuring up to 0.6 cm.
IMPRESSION: 1. Extensive abnormality throughout of the upper-outer quadrant of
the right breast including multiple areas of distortion and masses
as well as suspicious calcifications spanning 10 x 12 cm.

2. Additional small indeterminate mass in the medial aspect at 2:30
o'clock.

3.  Right axillary adenopathy.

4. Benign cyst or focally dilated duct in the left breast at 3
o'clock.

5. Indeterminate multiple small groups of calcifications in the
upper central and upper outer left breast.

6.  Left axillary adenopathy.

RECOMMENDATION:
1. Ultrasound-guided core needle biopsy x 4 of the right breast and
axilla. Recommend targeting the breast masses at 2:30 o'clock, 9
o'clock and 10 o'clock.

2. Stereotactic core needle biopsy x 2 of the left breast, targeting
a group of calcifications in the central breast and a group in the
outer breast.

3. Ultrasound-guided core needle biopsy of a left axillary lymph
node.

4. Depending on the results of the above biopsies, there are
additional sites in the right breast at could be targeted for extent
of disease. If the left breast biopsies return benign, consider
breast MRI before proceeding to surgical management of the right
breast.

I have discussed the findings and recommendations with the patient
who agrees to proceed with biopsies. Will attempt to biopsy the
right breast on one day and the left breast on a separate day.

BI-RADS CATEGORY  5: Highly suggestive of malignancy.

## 2022-07-16 LAB — COLOGUARD: COLOGUARD: POSITIVE — AB

## 2023-08-24 ENCOUNTER — Encounter: Payer: Self-pay | Admitting: Emergency Medicine

## 2023-08-24 ENCOUNTER — Other Ambulatory Visit: Payer: Self-pay

## 2023-08-24 ENCOUNTER — Emergency Department
Admission: EM | Admit: 2023-08-24 | Discharge: 2023-08-24 | Discharge status: Against Medical Advice | Attending: Emergency Medicine | Admitting: Emergency Medicine

## 2023-08-24 DIAGNOSIS — Z5321 Procedure and treatment not carried out due to patient leaving prior to being seen by health care provider: Secondary | ICD-10-CM | POA: Diagnosis not present

## 2023-08-24 DIAGNOSIS — G43909 Migraine, unspecified, not intractable, without status migrainosus: Secondary | ICD-10-CM | POA: Diagnosis present

## 2023-08-24 DIAGNOSIS — Z853 Personal history of malignant neoplasm of breast: Secondary | ICD-10-CM | POA: Diagnosis not present

## 2023-08-24 HISTORY — DX: Malignant neoplasm of unspecified site of unspecified female breast: C50.919

## 2023-08-24 LAB — CBC WITH DIFFERENTIAL/PLATELET
Abs Immature Granulocytes: 0.03 10*3/uL (ref 0.00–0.07)
Basophils Absolute: 0.1 10*3/uL (ref 0.0–0.1)
Basophils Relative: 1 %
Eosinophils Absolute: 0 10*3/uL (ref 0.0–0.5)
Eosinophils Relative: 0 %
HCT: 40.8 % (ref 36.0–46.0)
Hemoglobin: 13.2 g/dL (ref 12.0–15.0)
Immature Granulocytes: 0 %
Lymphocytes Relative: 26 %
Lymphs Abs: 2.4 10*3/uL (ref 0.7–4.0)
MCH: 29.5 pg (ref 26.0–34.0)
MCHC: 32.4 g/dL (ref 30.0–36.0)
MCV: 91.1 fL (ref 80.0–100.0)
Monocytes Absolute: 0.9 10*3/uL (ref 0.1–1.0)
Monocytes Relative: 10 %
Neutro Abs: 5.8 10*3/uL (ref 1.7–7.7)
Neutrophils Relative %: 63 %
Platelets: 213 10*3/uL (ref 150–400)
RBC: 4.48 MIL/uL (ref 3.87–5.11)
RDW: 13.3 % (ref 11.5–15.5)
WBC: 9.2 10*3/uL (ref 4.0–10.5)
nRBC: 0 % (ref 0.0–0.2)

## 2023-08-24 LAB — COMPREHENSIVE METABOLIC PANEL WITH GFR
ALT: 21 U/L (ref 0–44)
AST: 20 U/L (ref 15–41)
Albumin: 3.7 g/dL (ref 3.5–5.0)
Alkaline Phosphatase: 47 U/L (ref 38–126)
Anion gap: 10 (ref 5–15)
BUN: 14 mg/dL (ref 8–23)
CO2: 24 mmol/L (ref 22–32)
Calcium: 8.7 mg/dL — ABNORMAL LOW (ref 8.9–10.3)
Chloride: 103 mmol/L (ref 98–111)
Creatinine, Ser: 0.58 mg/dL (ref 0.44–1.00)
GFR, Estimated: >60 mL/min (ref >60)
Glucose, Bld: 115 mg/dL — ABNORMAL HIGH (ref 70–99)
Potassium: 4.1 mmol/L (ref 3.5–5.1)
Sodium: 137 mmol/L (ref 135–145)
Total Bilirubin: 0.6 mg/dL (ref 0.0–1.2)
Total Protein: 6.6 g/dL (ref 6.5–8.1)

## 2023-08-24 NOTE — ED Triage Notes (Signed)
 Pt presents to the ED via POV with complaints of migraine x 2 days. She notes taking Tylenol  - last dose around 1500 with minimal improvement. Rates the pain 6/10. Hx of breast cancer - in active treatment. A&Ox4 at this time. Denies vision changes, dizziness, CP or SOB.

## 2024-01-16 ENCOUNTER — Ambulatory Visit: Admit: 2024-01-16 | Admitting: Ophthalmology

## 2024-01-16 SURGERY — PHACOEMULSIFICATION, CATARACT, WITH IOL INSERTION
Anesthesia: Topical | Laterality: Left

## 2024-01-30 ENCOUNTER — Ambulatory Visit: Admit: 2024-01-30 | Admitting: Ophthalmology

## 2024-01-30 SURGERY — PHACOEMULSIFICATION, CATARACT, WITH IOL INSERTION
Anesthesia: Topical | Laterality: Right

## 2024-02-16 ENCOUNTER — Other Ambulatory Visit: Payer: Self-pay | Admitting: Medical Genetics

## 2024-02-18 ENCOUNTER — Other Ambulatory Visit
Admission: RE | Admit: 2024-02-18 | Discharge: 2024-02-18 | Payer: Self-pay | Attending: Medical Genetics | Admitting: Medical Genetics

## 2024-02-28 LAB — GENECONNECT MOLECULAR SCREEN: Genetic Analysis Overall Interpretation: NEGATIVE
